# Patient Record
Sex: Female | Born: 1985 | Race: White | Hispanic: No | Marital: Married | Smoking: Former smoker
Health system: Southern US, Community
[De-identification: ages and names within clinical notes are randomized; demographics above are authoritative.]

## PROBLEM LIST (undated history)

## (undated) DIAGNOSIS — D649 Anemia, unspecified: Secondary | ICD-10-CM

---
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
## 2005-03-15 ENCOUNTER — Emergency Department: Payer: Self-pay | Admitting: Emergency Medicine

## 2006-10-09 IMAGING — US US OB < 14 WEEKS - US OB TV
1 series · 16 of 28 positions shown · non-contrast
Comparison: none

REASON FOR EXAM: Pain
COMMENTS:  LMP: N/A

[Series 1: us ob < 14 weeks - us ob tv · 16 of 45 slices shown]
[im 1/45]
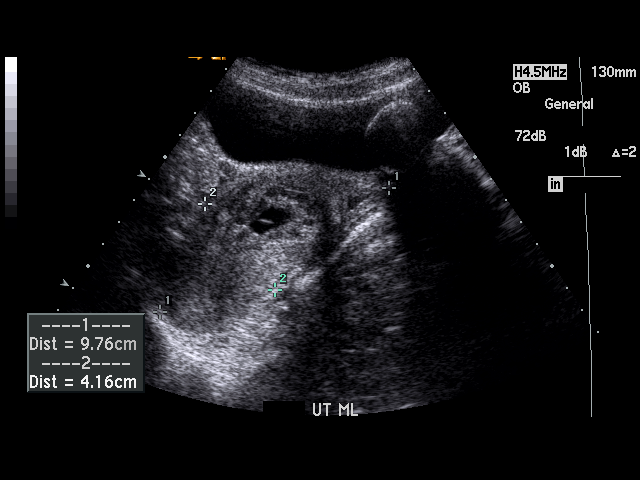
[im 4/45]
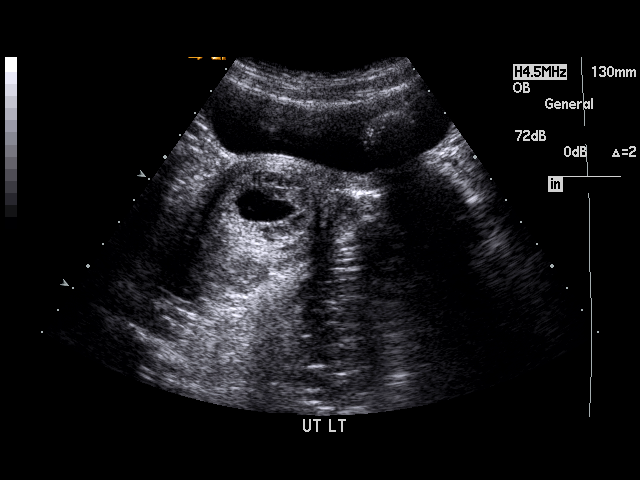
[im 7/45]
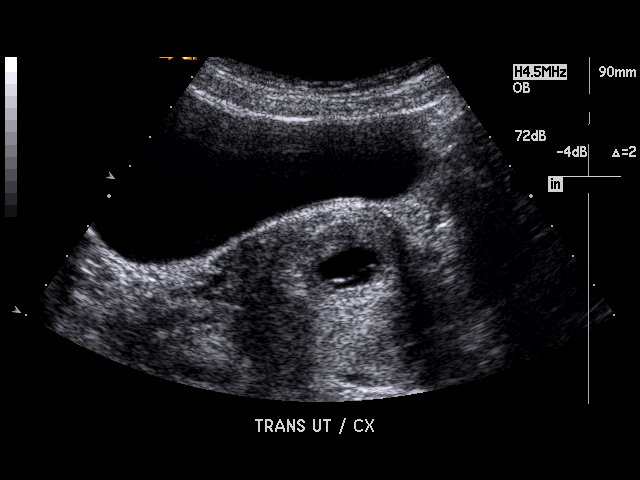
[im 10/45]
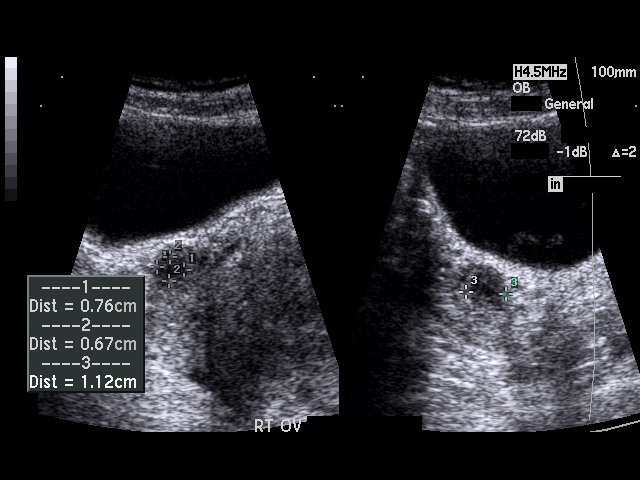
[im 12/45]
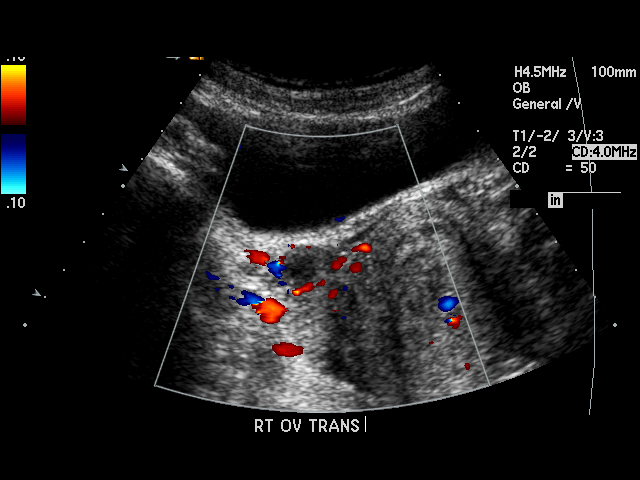
[im 15/45]
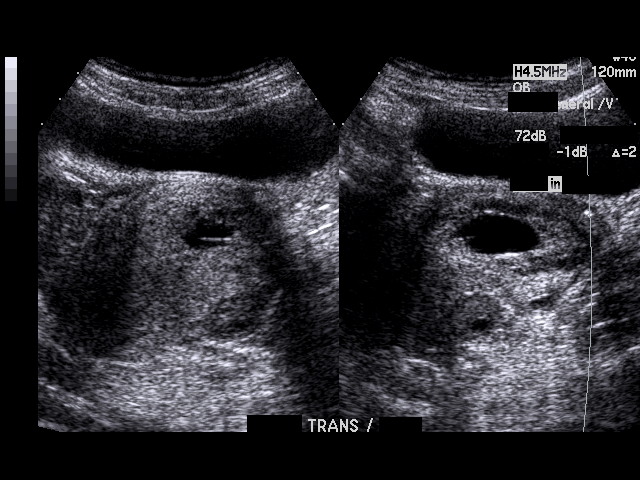
[im 18/45]
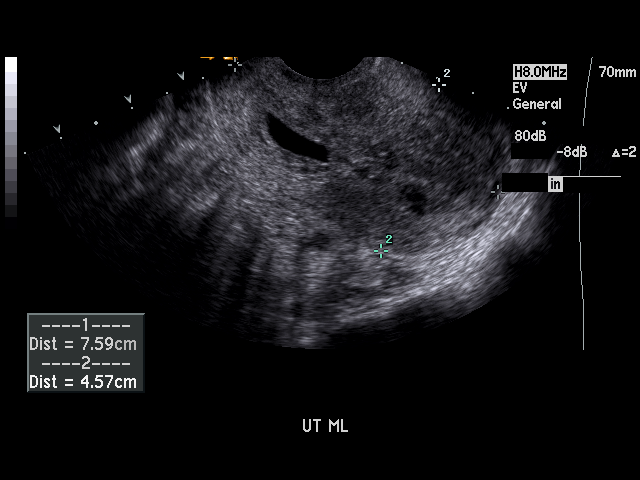
[im 22/45]
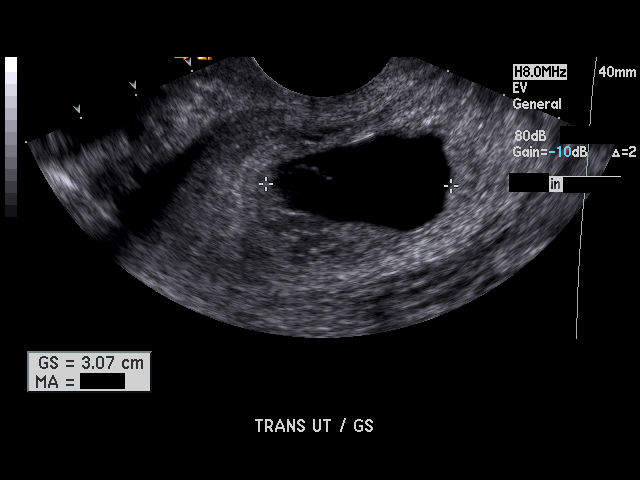
[im 23/45]
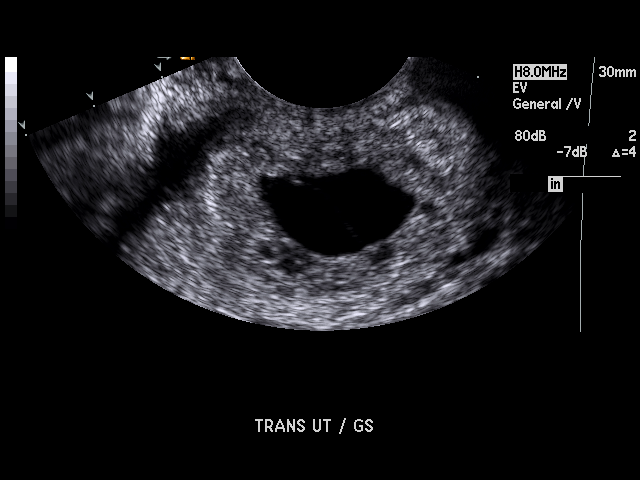
[im 27/45]
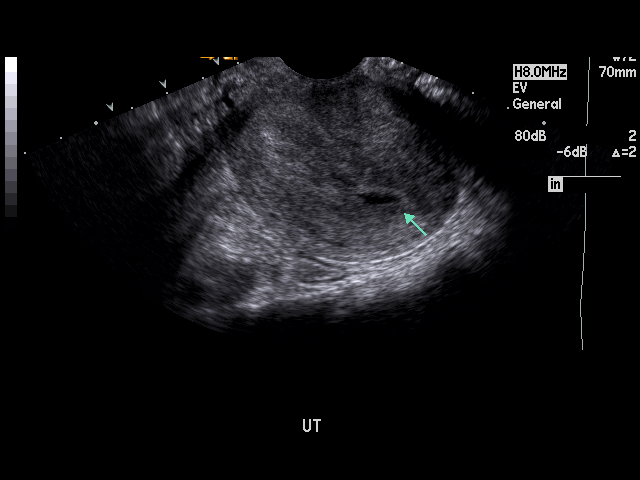
[im 30/45]
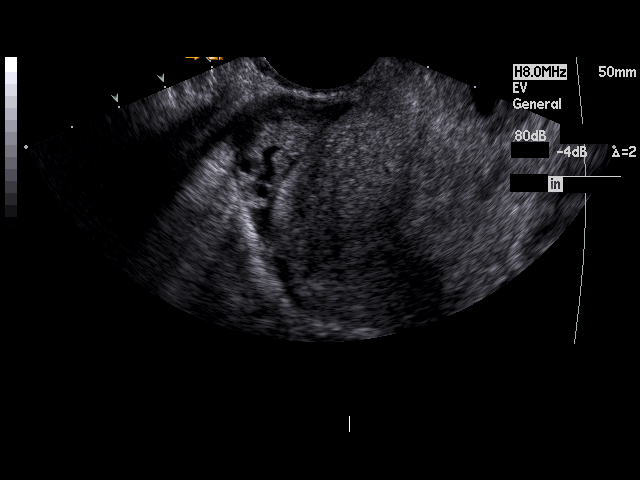
[im 33/45]
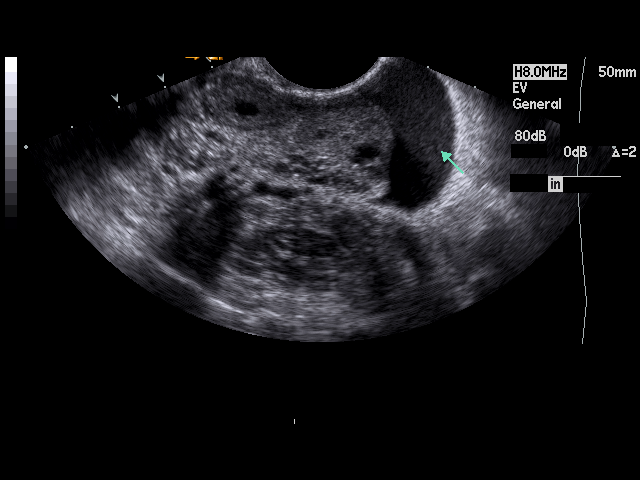
[im 35/45]
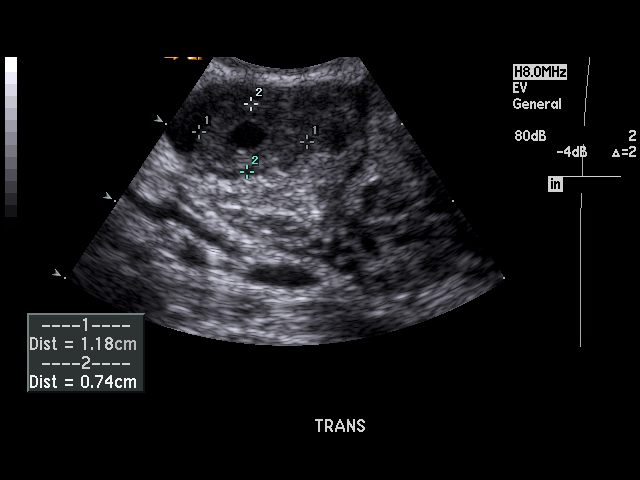
[im 38/45]
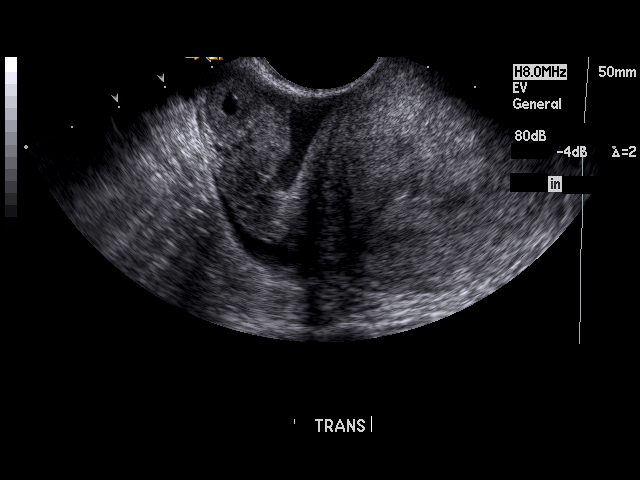
[im 41/45]
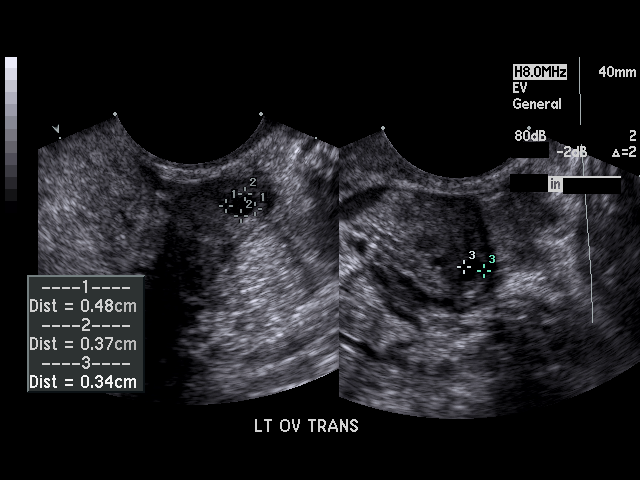
[im 45/45]
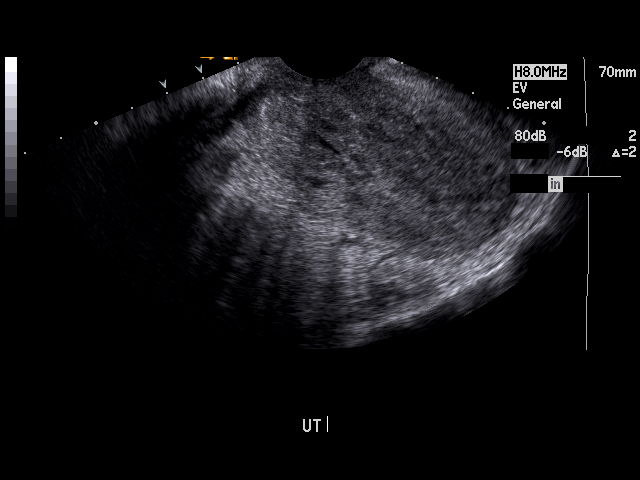

[16 of 28 positions shown; findings below may reference images not displayed]

PROCEDURE:     US  - US OB LESS THAN 14 WEEKS  - March 15, 2005 [DATE]

RESULT:          An intrauterine gestational sac is appreciated which
measures approximately 3.07 cm. This corresponds to an estimated gestational
age of 8 weeks, 0 days.  There does not appear to be evidence of a fetal
pole nor evidence of a yolk sac.  The remaining components of the uterus
appear to demonstrate a homogeneous echotexture.  The uterus measures 9.8 x
4.2 x 5.4 cm.  Complex free fluid is demonstrated in the region of the RIGHT
adnexa.  The RIGHT ovary measures 1.8 x 1.7 x 3.7 cm.  A cystic area is
demonstrated within the RIGHT ovary likely representing a corpus luteal cyst
measuring 1.2 x 0.7 cm.  As stated above, complex free fluid is demonstrated
within the RIGHT adnexal region.

The LEFT ovary measures 2.2 x 1.1 x 2 cm.  A hypoechoic focus is
demonstrated within the LEFT ovary measuring 0.5 x 0.4 x 0.3 cm also
possibly representing a follicle.  There does not appear to be evidence of
LEFT adnexal masses, free fluid, nor drainable loculated fluid collections.
IMPRESSION: 1.     Findings which appear to be consistent with a blighted ovum.  Repeat
evaluation is recommended if it is clinically warranted.
2.     Complex free fluid within the RIGHT adnexal region which may be
secondary to the uterine findings.
3.     Findings which appear to be consistent with a resolving corpus luteal
cyst in the RIGHT ovary as well as possibly a follicle within the LEFT
ovary.
4.     The gestational sac appears to correspond with an estimated
gestational age of 8 weeks and 0 days.  No evidence of a fetal heart tone,
yolk sac, nor fetal pole is appreciated.
5.     These findings were relayed to Dr. Niedercorn-Scholtes [place], of the emergency
department, at the time of the initial interpretation.

## 2006-12-22 ENCOUNTER — Inpatient Hospital Stay: Payer: Self-pay | Admitting: Obstetrics & Gynecology

## 2009-05-11 ENCOUNTER — Emergency Department: Payer: Self-pay | Admitting: Emergency Medicine

## 2009-10-26 ENCOUNTER — Inpatient Hospital Stay: Payer: Self-pay | Admitting: Obstetrics and Gynecology

## 2018-01-06 ENCOUNTER — Ambulatory Visit: Payer: Medicaid Other | Admitting: Obstetrics and Gynecology

## 2018-08-23 ENCOUNTER — Other Ambulatory Visit: Payer: Self-pay

## 2018-08-23 ENCOUNTER — Emergency Department
Admission: EM | Admit: 2018-08-23 | Discharge: 2018-08-23 | Disposition: A | Discharge status: Home / Self Care | Payer: Medicaid Other | Attending: Emergency Medicine | Admitting: Emergency Medicine

## 2018-08-23 ENCOUNTER — Emergency Department: Payer: Medicaid Other

## 2018-08-23 DIAGNOSIS — O26891 Other specified pregnancy related conditions, first trimester: Secondary | ICD-10-CM

## 2018-08-23 DIAGNOSIS — O200 Threatened abortion: Secondary | ICD-10-CM

## 2018-08-23 DIAGNOSIS — Z3A01 Less than 8 weeks gestation of pregnancy: Secondary | ICD-10-CM | POA: Insufficient documentation

## 2018-08-23 HISTORY — DX: Anemia, unspecified: D64.9

## 2018-08-23 LAB — COMPREHENSIVE METABOLIC PANEL
ALT: 13 U/L (ref 0–44)
AST: 14 U/L — ABNORMAL LOW (ref 15–41)
Albumin: 4.5 g/dL (ref 3.5–5.0)
Alkaline Phosphatase: 53 U/L (ref 38–126)
Anion gap: 9 (ref 5–15)
BUN: 9 mg/dL (ref 6–20)
CO2: 23 mmol/L (ref 22–32)
Calcium: 9 mg/dL (ref 8.9–10.3)
Chloride: 105 mmol/L (ref 98–111)
Creatinine, Ser: 0.51 mg/dL (ref 0.44–1.00)
GFR calc Af Amer: >60 mL/min (ref >60)
GFR calc non Af Amer: >60 mL/min (ref >60)
Glucose, Bld: 110 mg/dL — ABNORMAL HIGH (ref 70–99)
Potassium: 3.6 mmol/L (ref 3.5–5.1)
Sodium: 137 mmol/L (ref 135–145)
Total Bilirubin: 0.5 mg/dL (ref 0.3–1.2)
Total Protein: 7.3 g/dL (ref 6.5–8.1)

## 2018-08-23 LAB — URINALYSIS, COMPLETE (UACMP) WITH MICROSCOPIC
Bacteria, UA: NONE SEEN
Bilirubin Urine: NEGATIVE
Glucose, UA: NEGATIVE mg/dL
Ketones, ur: NEGATIVE mg/dL
Leukocytes,Ua: NEGATIVE
Nitrite: NEGATIVE
Protein, ur: NEGATIVE mg/dL
Specific Gravity, Urine: 1.018 (ref 1.005–1.030)
Squamous Epithelial / LPF: NONE SEEN (ref 0–5)
pH: 7 (ref 5.0–8.0)

## 2018-08-23 LAB — CBC WITH DIFFERENTIAL/PLATELET
Abs Immature Granulocytes: 0.02 10*3/uL (ref 0.00–0.07)
Basophils Absolute: 0 10*3/uL (ref 0.0–0.1)
Basophils Relative: 1 %
Eosinophils Absolute: 0.2 10*3/uL (ref 0.0–0.5)
Eosinophils Relative: 3 %
HCT: 39.7 % (ref 36.0–46.0)
Hemoglobin: 13.6 g/dL (ref 12.0–15.0)
Immature Granulocytes: 0 %
Lymphocytes Relative: 29 %
Lymphs Abs: 2.2 10*3/uL (ref 0.7–4.0)
MCH: 30.2 pg (ref 26.0–34.0)
MCHC: 34.3 g/dL (ref 30.0–36.0)
MCV: 88 fL (ref 80.0–100.0)
Monocytes Absolute: 0.6 10*3/uL (ref 0.1–1.0)
Monocytes Relative: 8 %
Neutro Abs: 4.4 10*3/uL (ref 1.7–7.7)
Neutrophils Relative %: 59 %
Platelets: 294 10*3/uL (ref 150–400)
RBC: 4.51 MIL/uL (ref 3.87–5.11)
RDW: 12.2 % (ref 11.5–15.5)
WBC: 7.5 10*3/uL (ref 4.0–10.5)
nRBC: 0 % (ref 0.0–0.2)

## 2018-08-23 LAB — HCG, QUANTITATIVE, PREGNANCY: hCG, Beta Chain, Quant, S: 2115 m[IU]/mL — ABNORMAL HIGH (ref <5)

## 2018-08-23 MED ORDER — SODIUM CHLORIDE 0.9 % IV SOLN: Freq: Once | INTRAVENOUS | Status: DC

## 2018-08-23 MED ORDER — ONDANSETRON 4 MG PO TBDP: 4.0000 mg | ORAL_TABLET | Freq: Three times a day (TID) | ORAL | 0 refills | Status: DC | PRN

## 2018-08-23 MED ORDER — ACETAMINOPHEN 325 MG PO TABS
650.0000 mg | ORAL_TABLET | Freq: Once | ORAL | Status: AC
  Administered 2018-08-23: 650 mg via ORAL
  Filled 2018-08-23: qty 2

## 2018-08-23 MED ORDER — ONDANSETRON HCL 4 MG/2ML IJ SOLN
4.0000 mg | Freq: Once | INTRAMUSCULAR | Status: AC
  Administered 2018-08-23: 4 mg via INTRAVENOUS
  Filled 2018-08-23: qty 2

## 2018-08-23 MED ORDER — SODIUM CHLORIDE 0.9 % IV SOLN
Freq: Once | INTRAVENOUS | Status: AC
  Administered 2018-08-23: 14:00:00 via INTRAVENOUS

## 2018-08-23 NOTE — ED Triage Notes (Signed)
Pt c/o BL rib/upper abd pain with N/v/d and SOB for the past 5 days

## 2018-08-23 NOTE — ED Provider Notes (Signed)
[facility] Emergency Department Provider Note       Time seen: ----------------------------------------- 1:06 PM on 08/23/2018 -----------------------------------------   I have reviewed the triage vital signs and the nursing notes.  HISTORY   Chief Complaint Abdominal Pain and Shortness of Breath   HPI Dawn Weaver is a 33 y.o. female with a history of anemia who presents to the ED for bilateral rib and upper abdominal pain with nausea, vomiting and diarrhea and shortness of breath for the past 5 days.  Patient states she has not had a menstrual cycle since April.  She has seen some blood vaginally.  Pain is 7 out of 10 on her chest wall.  Past Medical History:  Diagnosis Date  . Anemia     There are no active problems to display for this patient.   History reviewed. No pertinent surgical history.  Allergies Patient has no known allergies.  Social History Social History   Tobacco Use  . Smoking status: Not on file  Substance Use Topics  . Alcohol use: Not on file  . Drug use: Not on file   Review of Systems Constitutional: Negative for fever. Cardiovascular: Positive for chest pain Respiratory: Positive for shortness of breath Gastrointestinal: Positive for abdominal pain with nausea, vomiting and diarrhea Genitourinary: Positive for vaginal bleeding Musculoskeletal: Negative for back pain. Skin: Negative for rash. Neurological: Negative for headaches, focal weakness or numbness.  All systems negative/normal/unremarkable except as stated in the HPI  ____________________________________________   PHYSICAL EXAM:  VITAL SIGNS: ED Triage Vitals  Enc Vitals Group     BP 08/23/18 1128 (!) 148/93     Pulse Rate 08/23/18 1128 83     Resp 08/23/18 1128 18     Temp 08/23/18 1128 98.3 F (36.8 C)     Temp Source 08/23/18 1128 Oral     SpO2 08/23/18 1128 100 %     Weight 08/23/18 1131 123 lb (55.8 kg)     Height 08/23/18 1131 5'  2" (1.575 m)     Head Circumference --      Peak Flow --      Pain Score 08/23/18 1130 7     Pain Loc --      Pain Edu? --      Excl. in [place]? --    Constitutional: Alert and oriented. Well appearing and in no distress. Eyes: Conjunctivae are normal. Normal extraocular movements. ENT      Head: Normocephalic and atraumatic.      Nose: No congestion/rhinnorhea.      Mouth/Throat: Mucous membranes are moist.      Neck: No stridor. Cardiovascular: Normal rate, regular rhythm. No murmurs, rubs, or gallops. Respiratory: Normal respiratory effort without tachypnea nor retractions. Breath sounds are clear and equal bilaterally. No wheezes/rales/rhonchi. Gastrointestinal: Soft and nontender. Normal bowel sounds Musculoskeletal: Nontender with normal range of motion in extremities. No lower extremity tenderness nor edema. Neurologic:  Normal speech and language. No gross focal neurologic deficits are appreciated.  Skin:  Skin is warm, dry and intact. No rash noted. Psychiatric: Mood and affect are normal. Speech and behavior are normal.  ____________________________________________  ED COURSE:  As part of my medical decision making, I reviewed the following data within the electronic MEDICAL RECORD NUMBER History obtained from family if available, nursing notes, old chart and ekg, as well as notes from prior ED visits. Patient presented for possible pregnancy, we will assess with labs and imaging as indicated at this time.  Procedures  Dawn Weaver was evaluated in Emergency Department on 08/23/2018 for the symptoms described in the history of present illness. She was evaluated in the context of the global COVID-19 pandemic, which necessitated consideration that the patient might be at risk for infection with the SARS-CoV-2 virus that causes COVID-19. Institutional protocols and algorithms that pertain to the evaluation of patients at risk for COVID-19 are in a state of rapid change based on  information released by regulatory bodies including the CDC and federal and state organizations. These policies and algorithms were followed during the patient's care in the ED.  ____________________________________________   LABS (pertinent positives/negatives)  Labs Reviewed  COMPREHENSIVE METABOLIC PANEL - Abnormal; Notable for the following components:      Result Value   Glucose, Bld 110 (*)    AST 14 (*)    All other components within normal limits  URINALYSIS, COMPLETE (UACMP) WITH MICROSCOPIC - Abnormal; Notable for the following components:   Color, Urine YELLOW (*)    APPearance CLEAR (*)    Hgb urine dipstick SMALL (*)    All other components within normal limits  HCG, QUANTITATIVE, PREGNANCY - Abnormal; Notable for the following components:   hCG, Beta Chain, Quant, S 2,115 (*)    All other components within normal limits  CBC WITH DIFFERENTIAL/PLATELET    RADIOLOGY Images were viewed by me  Pregnancy ultrasound IMPRESSION: 1. Probable early intrauterine gestational sac, but no yolk sac, fetal pole, or cardiac activity yet visualized. Recommend follow-up quantitative B-HCG levels and follow-up US in 14 days to assess viability. This recommendation follows SRU consensus guidelines: Diagnostic Criteria for Nonviable Pregnancy Early in the First Trimester. Malva Limes Med 2013; 173:5670-14. 2. 2.5 cm corpus luteum cyst in the right ovary. ____________________________________________   DIFFERENTIAL DIAGNOSIS   Threatened miscarriage, ectopic, dehydration, electrolyte abnormality  FINAL ASSESSMENT AND PLAN  First trimester pregnancy, threatened miscarriage   Plan: The patient had presented for symptoms of early pregnancy, most of her symptoms seem to start with persistent nausea, vomiting and poor p.o. intake. Patient's labs did confirm early pregnancy. Patient's imaging did reveal a probable early intrauterine gestational sac.  We will have her follow-up here or with  her OB/GYN doctor in 2 days for recheck.   Ulice Dash, MD    Note: This note was generated in part or whole with voice recognition software. Voice recognition is usually quite accurate but there are transcription errors that can and very often do occur. I apologize for any typographical errors that were not detected and corrected.     Emily Filbert, MD 08/23/18 [phone]

## 2018-08-23 NOTE — ED Notes (Signed)
Pt reports that she is unable to void or ambulate to restroom without becoming SHOB d/t the pain

## 2018-08-23 NOTE — ED Notes (Signed)
Patient transported to Ultrasound 

## 2018-08-23 NOTE — ED Notes (Signed)
Pt assisted to toilet to void 

## 2018-08-23 NOTE — ED Notes (Signed)
Pt requested assistance to void - after obtaining urine sample there was no blood noted on toilet tissue or in toilet Pt now reports that the pain radiates to left flank and back

## 2018-08-23 NOTE — ED Notes (Signed)
Pt reports no period since April 24th - she c/o right upper abd pain that started Friday - she reports that she started spotting Saturday with N/V - today she reports increase in nausea, bleeding and abd pain  SHOB that started Saturday with the spotting

## 2018-10-21 ENCOUNTER — Emergency Department
Admission: EM | Admit: 2018-10-21 | Discharge: 2018-10-21 | Disposition: A | Discharge status: Home / Self Care | Payer: Medicaid Other | Attending: Student in an Organized Health Care Education/Training Program | Admitting: Student in an Organized Health Care Education/Training Program

## 2018-10-21 ENCOUNTER — Encounter: Payer: Self-pay | Admitting: Emergency Medicine

## 2018-10-21 ENCOUNTER — Other Ambulatory Visit: Payer: Self-pay

## 2018-10-21 DIAGNOSIS — O039 Complete or unspecified spontaneous abortion without complication: Secondary | ICD-10-CM | POA: Insufficient documentation

## 2018-10-21 DIAGNOSIS — Z3A00 Weeks of gestation of pregnancy not specified: Secondary | ICD-10-CM | POA: Insufficient documentation

## 2018-10-21 LAB — HCG, QUANTITATIVE, PREGNANCY: hCG, Beta Chain, Quant, S: 1 m[IU]/mL (ref <5)

## 2018-10-21 LAB — POCT PREGNANCY, URINE: Preg Test, Ur: NEGATIVE

## 2018-10-21 NOTE — Discharge Instructions (Signed)
Your hormone levels are back to 0 (zero) by blood and urine testing. This confirms that your previous miscarriage is complete. You likely had some small amount hormone level still circulating in the blood/urine last week when you tested. You can expect to have a normal period in the next 2-4 weeks. Follow-up with [facility] Department, or return as needed.

## 2018-10-21 NOTE — ED Triage Notes (Signed)
Pt reports had a miscarriage in June and has taken 6 pregnancy test since then with the last one being last week and all were positive. Pt concerned she may be pregnant again or is not sure what is going on.

## 2018-10-21 NOTE — ED Provider Notes (Signed)
[facility] Emergency Department Provider Note ____________________________________________  Time seen: 1406  I have reviewed the triage vital signs and the nursing notes.  HISTORY  Chief Complaint  Vaginal Bleeding  HPI Dawn Weaver is a 33 y.o. female presents with self to the ED for evaluation of some scant vaginal bleeding as well as concern over previous home pregnancy test being positive.  Patient apparently was found to be pregnant in June, but a [redacted] weeks gestation, was diagnosed with a threatened abortion.  She was evaluated by [facility], and was due to have serial beta quant drawn.  Her last visit was on June 3, where she was confirmed by ultrasound to have a non-viable IUP.  Patient was unable to follow[facility] with [facility] secondary to financial constraints as she did not have insurance at the time.  Patient admits to about 10 days of heavy bleeding and spotting at the end of last month, consistent with her expected spontaneous abortion.  Patient has, however, continued to take home pregnancy tests, including the most recently taken about a week prior, which remain positive.  She is here because she is not sure whether she is pregnant with a new pregnancy or if this is still possibly related to her previous miscarriage.  She has never experienced a miscarriage before, and was not sure that she would continue to have positive home pricey test but she denies any fevers, chills, sweats patient also denies any pelvic pain, vaginal discharge, or abdominal pain.  She is here for evaluation as noted.  She reports only spotting at this time.  Past Medical History:  Diagnosis Date  . Anemia     There are no active problems to display for this patient.   History reviewed. No pertinent surgical history.  Prior to Admission medications   Medication Sig Start Date End Date Taking? Authorizing Provider  ondansetron (ZOFRAN ODT) 4 MG disintegrating  tablet Take 1 tablet (4 mg total) by mouth every 8 (eight) hours as needed for nausea or vomiting. 08/23/18   Emily FilbertWilliams, Jonathan E, MD    Allergies Patient has no known allergies.  No family history on file.  Social History Social History   Tobacco Use  . Smoking status: Not on file  Substance Use Topics  . Alcohol use: Not on file  . Drug use: Not on file    Review of Systems  Constitutional: Negative for fever. Eyes: Negative for visual changes. ENT: Negative for sore throat. Cardiovascular: Negative for chest pain. Respiratory: Negative for shortness of breath. Gastrointestinal: Negative for abdominal pain, vomiting and diarrhea. Genitourinary: Negative for dysuria.  Scant vaginal bleeding. Musculoskeletal: Negative for back pain. Skin: Negative for rash. Neurological: Negative for headaches, focal weakness or numbness. ____________________________________________  PHYSICAL EXAM:  VITAL SIGNS: ED Triage Vitals  Enc Vitals Group     BP 10/21/18 1021 135/82     Pulse Rate 10/21/18 1021 82     Resp 10/21/18 1021 18     Temp 10/21/18 1021 98.7 F (37.1 C)     Temp Source 10/21/18 1021 Oral     SpO2 10/21/18 1021 100 %     Weight 10/21/18 1012 110 lb (49.9 kg)     Height 10/21/18 1012 5\' 2"  (1.575 m)     Head Circumference --      Peak Flow --      Pain Score 10/21/18 1012 0     Pain Loc --      Pain  Edu? --      Excl. in [place]? --     Constitutional: Alert and oriented. Well appearing and in no distress. Head: Normocephalic and atraumatic. Eyes: Conjunctivae are normal. Normal extraocular movements Cardiovascular: Normal rate, regular rhythm. Normal distal pulses. Respiratory: Normal respiratory effort. No wheezes/rales/rhonchi. Gastrointestinal: Soft and nontender. No distention. GU: deferred Musculoskeletal: Nontender with normal range of motion in all extremities.  Neurologic:  Normal gait without ataxia. Normal speech and language. No gross focal neurologic  deficits are appreciated. Skin:  Skin is warm, dry and intact. No rash noted. Psychiatric: Mood and affect are normal. Patient exhibits appropriate insight and judgment. ____________________________________________   LABS (pertinent positives/negatives) Labs Reviewed  HCG, QUANTITATIVE, PREGNANCY  POC URINE PREG, ED  POCT PREGNANCY, URINE  ____________________________________________  PROCEDURES  Procedures ____________________________________________  INITIAL IMPRESSION / ASSESSMENT AND PLAN / ED COURSE  Dawn Weaver was evaluated in Emergency Department on 10/21/2018 for the symptoms described in the history of present illness. She was evaluated in the context of the global COVID-19 pandemic, which necessitated consideration that the patient might be at risk for infection with the SARS-CoV-2 virus that causes COVID-19. Institutional protocols and algorithms that pertain to the evaluation of patients at risk for COVID-19 are in a state of rapid change based on information released by regulatory bodies including the CDC and federal and state organizations. These policies and algorithms were followed during the patient's care in the ED.  Patient with ED evaluation over continue positive home pregnancy test despite a confirmed spontaneous abortion last month.  Patient was found today to have a negative beta quant by both blood and urine.  I discussed with the patient likelihood that even as late as last week, she may have had enough circulating hormone to give her a positive home urine pregnancy test.  Patient is understanding today that her urine and blood labs have confirmed that she has completely miscarried and does not currently have a pregnancy.  Patient is expecting that she may in fact start her menstrual period with her current spotting.  She verbalized understanding of her discharge instructions, and is referred to the local health department for ongoing symptoms.  Patient is  discharged at this time to follow-up as discussed. ____________________________________________  FINAL CLINICAL IMPRESSION(S) / ED DIAGNOSES  Final diagnoses:  Complete miscarriage     Carmie End, Dannielle Karvonen, PA-C 10/21/18 2021    Merlyn Lot, MD 10/21/18 2100

## 2018-10-21 NOTE — ED Notes (Signed)
Pt states she was 5 and 1/[redacted] weeks pregnant, she had a miscarriage on June 3rd, 2020. She took a pregnancy test on June 25th, and it was positive. She has taken 5 pregnancy test since June 25th and all results were positive. She woke up yesterday with brown vaginal discharge, then last night and this morning she has experienced bright red vaginalbleeding.

## 2019-01-12 LAB — OB RESULTS CONSOLE RUBELLA ANTIBODY, IGM: Rubella: IMMUNE

## 2019-01-12 LAB — OB RESULTS CONSOLE VARICELLA ZOSTER ANTIBODY, IGG: Varicella: IMMUNE

## 2019-01-12 LAB — OB RESULTS CONSOLE HEPATITIS B SURFACE ANTIGEN: Hepatitis B Surface Ag: NEGATIVE

## 2019-03-25 NOTE — L&D Delivery Note (Signed)
Delivery Note  First Stage: Labor onset: 1830 Augmentation: Pitocin Analgesia /Anesthesia intrapartum: epidural SROM at 1830  Second Stage: Complete dilation at 0002 Onset of pushing at 0005 FHR second stage Cat II, variables with pushing  Delivery of a viable female infant on 08/02/19 at 0030 by Heloise Ochoa CNM delivery of fetal head in LOA position with restitution to LOT. No nuchal cord;  Anterior then posterior shoulders delivered easily with gentle downward traction. Baby placed on mom's chest, and attended to by peds.  Cord double clamped after cessation of pulsation, cut by FOB. Cord blood sample collected   Third Stage: Placenta delivered spontaneously intact with 3VC @ 0039 Placenta disposition: routine disposal Uterine tone Firm / bleeding small  1st deg perineal laceration identified  Anesthesia for repair: epidural Repair in usual fashion with 2-0 Vicryl CT-1 Est. Blood Loss (mL): 250  Complications: none  Mom to postpartum.  Baby to Couplet care / Skin to Skin.  Newborn: Birth Weight: pending  Apgar Scores: 8/9 Feeding planned: Breast

## 2019-07-01 LAB — OB RESULTS CONSOLE GC/CHLAMYDIA
Chlamydia: NEGATIVE
Gonorrhea: NEGATIVE

## 2019-07-01 LAB — OB RESULTS CONSOLE GBS: GBS: NEGATIVE

## 2019-07-01 LAB — OB RESULTS CONSOLE RPR: RPR: NONREACTIVE

## 2019-07-01 LAB — OB RESULTS CONSOLE HIV ANTIBODY (ROUTINE TESTING): HIV: NONREACTIVE

## 2019-08-01 ENCOUNTER — Encounter: Payer: Self-pay | Admitting: Obstetrics and Gynecology

## 2019-08-01 ENCOUNTER — Other Ambulatory Visit: Payer: Self-pay

## 2019-08-01 ENCOUNTER — Inpatient Hospital Stay: Payer: Medicaid Other | Admitting: Anesthesiology

## 2019-08-01 ENCOUNTER — Inpatient Hospital Stay
Admission: EM | Admit: 2019-08-01 | Discharge: 2019-08-03 | DRG: 806 | Disposition: A | Discharge status: Home / Self Care | Payer: Medicaid Other | Attending: Obstetrics and Gynecology | Admitting: Obstetrics and Gynecology

## 2019-08-01 DIAGNOSIS — Z87891 Personal history of nicotine dependence: Secondary | ICD-10-CM | POA: Diagnosis not present

## 2019-08-01 DIAGNOSIS — O4292 Full-term premature rupture of membranes, unspecified as to length of time between rupture and onset of labor: Secondary | ICD-10-CM | POA: Diagnosis present

## 2019-08-01 DIAGNOSIS — O4200 Premature rupture of membranes, onset of labor within 24 hours of rupture, unspecified weeks of gestation: Secondary | ICD-10-CM | POA: Diagnosis present

## 2019-08-01 DIAGNOSIS — O9081 Anemia of the puerperium: Secondary | ICD-10-CM | POA: Diagnosis not present

## 2019-08-01 DIAGNOSIS — D62 Acute posthemorrhagic anemia: Secondary | ICD-10-CM | POA: Diagnosis not present

## 2019-08-01 DIAGNOSIS — O26893 Other specified pregnancy related conditions, third trimester: Secondary | ICD-10-CM | POA: Diagnosis present

## 2019-08-01 DIAGNOSIS — Z20822 Contact with and (suspected) exposure to covid-19: Secondary | ICD-10-CM | POA: Diagnosis present

## 2019-08-01 DIAGNOSIS — Z3A40 40 weeks gestation of pregnancy: Secondary | ICD-10-CM | POA: Diagnosis not present

## 2019-08-01 LAB — TYPE AND SCREEN
ABO/RH(D): O POS
Antibody Screen: NEGATIVE

## 2019-08-01 LAB — OB RESULTS CONSOLE ABO/RH

## 2019-08-01 LAB — CBC
HCT: 30.4 % — ABNORMAL LOW (ref 36.0–46.0)
Hemoglobin: 9.8 g/dL — ABNORMAL LOW (ref 12.0–15.0)
MCH: 25.3 pg — ABNORMAL LOW (ref 26.0–34.0)
MCHC: 32.2 g/dL (ref 30.0–36.0)
MCV: 78.4 fL — ABNORMAL LOW (ref 80.0–100.0)
Platelets: 316 10*3/uL (ref 150–400)
RBC: 3.88 MIL/uL (ref 3.87–5.11)
RDW: 15.6 % — ABNORMAL HIGH (ref 11.5–15.5)
WBC: 12.4 10*3/uL — ABNORMAL HIGH (ref 4.0–10.5)
nRBC: 0 % (ref 0.0–0.2)

## 2019-08-01 LAB — SARS CORONAVIRUS 2 BY RT PCR (HOSPITAL ORDER, PERFORMED IN ~~LOC~~ HOSPITAL LAB): SARS Coronavirus 2: NEGATIVE

## 2019-08-01 MED ORDER — ONDANSETRON HCL 4 MG/2ML IJ SOLN: 4.0000 mg | Freq: Four times a day (QID) | INTRAMUSCULAR | Status: DC | PRN

## 2019-08-01 MED ORDER — AMMONIA AROMATIC IN INHA
RESPIRATORY_TRACT | Status: AC
  Filled 2019-08-01: qty 10

## 2019-08-01 MED ORDER — ACETAMINOPHEN 325 MG PO TABS: 650.0000 mg | ORAL_TABLET | ORAL | Status: DC | PRN

## 2019-08-01 MED ORDER — MISOPROSTOL 200 MCG PO TABS
ORAL_TABLET | ORAL | Status: AC
  Filled 2019-08-01: qty 4

## 2019-08-01 MED ORDER — FENTANYL CITRATE (PF) 100 MCG/2ML IJ SOLN: 50.0000 ug | INTRAMUSCULAR | Status: DC | PRN

## 2019-08-01 MED ORDER — EPHEDRINE 5 MG/ML INJ: 10.0000 mg | INTRAVENOUS | Status: DC | PRN

## 2019-08-01 MED ORDER — OXYTOCIN BOLUS FROM INFUSION: 500.0000 mL | Freq: Once | INTRAVENOUS | Status: DC

## 2019-08-01 MED ORDER — LACTATED RINGERS IV SOLN: 500.0000 mL | INTRAVENOUS | Status: DC | PRN

## 2019-08-01 MED ORDER — LACTATED RINGERS IV SOLN: 500.0000 mL | Freq: Once | INTRAVENOUS | Status: DC

## 2019-08-01 MED ORDER — TERBUTALINE SULFATE 1 MG/ML IJ SOLN: 0.2500 mg | Freq: Once | INTRAMUSCULAR | Status: DC | PRN

## 2019-08-01 MED ORDER — OXYTOCIN 40 UNITS IN NORMAL SALINE INFUSION - SIMPLE MED
1.0000 m[IU]/min | INTRAVENOUS | Status: DC
  Administered 2019-08-01: 22:00:00 2 m[IU]/min via INTRAVENOUS
  Filled 2019-08-01: qty 1000

## 2019-08-01 MED ORDER — FENTANYL 2.5 MCG/ML W/ROPIVACAINE 0.15% IN NS 100 ML EPIDURAL (ARMC)
EPIDURAL | Status: AC
  Filled 2019-08-01: qty 100

## 2019-08-01 MED ORDER — SODIUM CHLORIDE 0.9 % IV SOLN
INTRAVENOUS | Status: DC | PRN
  Administered 2019-08-01 (×2): 5 mL via EPIDURAL

## 2019-08-01 MED ORDER — FENTANYL 2.5 MCG/ML W/ROPIVACAINE 0.15% IN NS 100 ML EPIDURAL (ARMC)
12.0000 mL/h | EPIDURAL | Status: DC
  Administered 2019-08-01: 12 mL/h via EPIDURAL

## 2019-08-01 MED ORDER — LIDOCAINE HCL (PF) 1 % IJ SOLN
INTRAMUSCULAR | Status: DC | PRN
  Administered 2019-08-01: 2 mL via SUBCUTANEOUS

## 2019-08-01 MED ORDER — LACTATED RINGERS IV SOLN: INTRAVENOUS | Status: DC

## 2019-08-01 MED ORDER — LIDOCAINE HCL (PF) 1 % IJ SOLN
INTRAMUSCULAR | Status: AC
  Filled 2019-08-01: qty 30

## 2019-08-01 MED ORDER — PHENYLEPHRINE 40 MCG/ML (10ML) SYRINGE FOR IV PUSH (FOR BLOOD PRESSURE SUPPORT): 80.0000 ug | PREFILLED_SYRINGE | INTRAVENOUS | Status: DC | PRN

## 2019-08-01 MED ORDER — LIDOCAINE HCL (PF) 1 % IJ SOLN: 30.0000 mL | INTRAMUSCULAR | Status: DC | PRN

## 2019-08-01 MED ORDER — DIPHENHYDRAMINE HCL 50 MG/ML IJ SOLN: 12.5000 mg | INTRAMUSCULAR | Status: DC | PRN

## 2019-08-01 MED ORDER — SOD CITRATE-CITRIC ACID 500-334 MG/5ML PO SOLN: 30.0000 mL | ORAL | Status: DC | PRN

## 2019-08-01 MED ORDER — OXYTOCIN 40 UNITS IN NORMAL SALINE INFUSION - SIMPLE MED
2.5000 [IU]/h | INTRAVENOUS | Status: DC
  Administered 2019-08-02: 2.5 [IU]/h via INTRAVENOUS
  Administered 2019-08-02: 01:00:00 39.96 [IU]/h via INTRAVENOUS
  Administered 2019-08-02: 01:00:00 2.5 [IU]/h via INTRAVENOUS
  Filled 2019-08-01: qty 1000

## 2019-08-01 MED ORDER — OXYTOCIN 10 UNIT/ML IJ SOLN
INTRAMUSCULAR | Status: AC
  Filled 2019-08-01: qty 2

## 2019-08-01 MED ORDER — LIDOCAINE-EPINEPHRINE (PF) 1.5 %-1:200000 IJ SOLN
INTRAMUSCULAR | Status: DC | PRN
  Administered 2019-08-01: 3 mL via EPIDURAL

## 2019-08-01 NOTE — Progress Notes (Signed)
Labor Progress Note  Dawn Weaver is a 34 y.o. [phone] at [redacted]w[redacted]d by ultrasound admitted for rupture of membranes  Subjective:  Feeling painful UCs and requested epidural.   Objective: BP (!) 110/57 (BP Location: Right Arm)   Pulse 97   Temp 98.3 F (36.8 C) (Oral)   Resp 18   Ht 5\' 2"  (1.575 m)   Wt 64.9 kg   LMP 10/20/2018 (Exact Date)   BMI 26.16 kg/m  Notable VS details: reveiwed  Fetal Assessment: FHT:  FHR: 125 bpm, variability: moderate,  accelerations:  Present,  decelerations:  Absent Category/reactivity:  Category I UC:   irregular, every 3-8 minutes, Pitocin started at 56mu/min SVE:   Last exam 2/75/-1 Membrane status: SROM at 1830 Amniotic color:  clear  Labs: Lab Results  Component Value Date   WBC 12.4 (H) 08/01/2019   HGB 9.8 (L) 08/01/2019   HCT 30.4 (L) 08/01/2019   MCV 78.4 (L) 08/01/2019   PLT 316 08/01/2019    Assessment / Plan: G6 P2032 at [redacted]w[redacted]d with SROM  Labor: started pitocin to augment irreg UC pattern.  Preeclampsia:  no e/o pre-e Fetal Wellbeing:  Category I Pain Control:  requesting epidural now.  I/D:  GBS neg Anticipated MOD:  NSVD  [redacted]w[redacted]d, CNM 08/01/2019, 10:58 PM

## 2019-08-01 NOTE — H&P (Signed)
OB History & Physical   History of Present Illness:  Chief Complaint: water broke this evening  HPI:  Dawn Weaver is a 34 y.o. [id number] female at [redacted]w[redacted]d dated by [redacted]w[redacted]d [place].  She presents to L&D for LOF  / SROM @ 1900, reports large gush of fluid that occurred just after taking a nap. Feeling pelvic pressure then felt a pop and large gush. Denies UCs, bloody show and reports active FM.   Pregnancy Issues: 1. Dating by [place], conceived after miscarriage late July 2020 2. Advanced maternal age, 42 at delivery 3. Anemia, not taking PO supplement d/t difficulty with pills.   Maternal Medical History:   Past Medical History:  Diagnosis Date  . Anemia     No past surgical history on file.  No Known Allergies  Prior to Admission medications   Medication Sig Start Date End Date Taking? Authorizing Provider  ondansetron (ZOFRAN ODT) 4 MG disintegrating tablet Take 1 tablet (4 mg total) by mouth every 8 (eight) hours as needed for nausea or vomiting. 08/23/18   Emily Filbert, MD     Prenatal care site: [facility]  Social History: She    Family History: no family hx Gyn cancers  Review of Systems: A full review of systems was performed and negative except as noted in the HPI.     Physical Exam:  Vital Signs: BP (!) 110/57 (BP Location: Right Arm)   Pulse 97   Temp 98.3 F (36.8 C) (Oral)   Resp 18   Ht 5\' 2"  (1.575 m)   Wt 64.9 kg   BMI 26.16 kg/m  General: no acute distress.  HEENT: normocephalic, atraumatic Heart: regular rate & rhythm.  No murmurs/rubs/gallops Lungs: clear to auscultation bilaterally, normal respiratory effort Abdomen: soft, gravid, non-tender;  EFW: 7lbs Pelvic:   External: Normal external female genitalia  Cervix: 2/75/-1, soft, midposition   Extremities: non-tender, symmetric, no edema bilaterally.  DTRs: 2+  Neurologic: Alert & oriented x 3.    No results found for this or any previous visit (from the past 24  hour(s)).  Pertinent Results:  Prenatal Labs: Blood type/Rh  O pos  Antibody screen neg  Rubella Immune  Varicella Immune  RPR NR  HBsAg Neg  HIV NR  GC neg  Chlamydia neg  Genetic screening 01/12/19: neg, XY; AFP negative  1 hour GTT  117  3 hour GTT  n/a  GBS  neg   FHT: 140bpm, mod variability, + accels, no decels TOCO: Irregular, toco adjusted. Palpated UC- moderate.    Cephalic by leopolds  No results found.  Assessment:  Dawn Weaver is a 34 y.o. G6 P46 female at [redacted]w[redacted]d with SROM.   Plan:  1. Admit to Labor & Delivery; consents reviewed and obtained - COVID testing on admission  2. Fetal Well being  - Fetal Tracing: Cat I tracing - Group B Streptococcus ppx indicated: negative - Presentation: cephalic confirmed by exam and Leopolds   3. Routine OB: - Prenatal labs reviewed, as above - Rh O pos - CBC, T&S, RPR on admit - Clear fluids, IVF  4. Monitoring of Labor -  Contractions: external toco in place -  Pelvis proven to 7#7 -  Plan for induction with Pitocin as needed -  Plan for continuous fetal monitoring  -  Maternal pain control as desired - Anticipate vaginal delivery  5. Post Partum Planning: - Infant feeding: breast - Contraception: NFP  [redacted]w[redacted]d, CNM 08/01/19 7:54  PM

## 2019-08-01 NOTE — Anesthesia Procedure Notes (Signed)
Epidural Patient location during procedure: OB Start time: 08/01/2019 10:59 PM End time: 08/01/2019 11:04 PM  Staffing Anesthesiologist: Lenard Simmer, MD Performed: anesthesiologist   Preanesthetic Checklist Completed: patient identified, IV checked, site marked, risks and benefits discussed, surgical consent, monitors and equipment checked, pre-op evaluation and timeout performed  Epidural Patient position: sitting Prep: ChloraPrep Patient monitoring: heart rate, continuous pulse ox and blood pressure Approach: midline Location: L3-L4 Injection technique: LOR saline  Needle:  Needle type: Tuohy  Needle gauge: 17 G Needle length: 9 cm and 9 Needle insertion depth: 4 cm Catheter type: closed end flexible Catheter size: 19 Gauge Catheter at skin depth: 9 cm Test dose: negative and 1.5% lidocaine with Epi 1:200 K  Assessment Sensory level: T10 Events: blood not aspirated, injection not painful, no injection resistance, no paresthesia and negative IV test  Additional Notes 1st attempt Pt. Evaluated and documentation done after procedure finished. Patient identified. Risks/Benefits/Options discussed with patient including but not limited to bleeding, infection, nerve damage, paralysis, failed block, incomplete pain control, headache, blood pressure changes, nausea, vomiting, reactions to medication both or allergic, itching and postpartum back pain. Confirmed with bedside nurse the patient's most recent platelet count. Confirmed with patient that they are not currently taking any anticoagulation, have any bleeding history or any family history of bleeding disorders. Patient expressed understanding and wished to proceed. All questions were answered. Sterile technique was used throughout the entire procedure. Please see nursing notes for vital signs. Test dose was given through epidural catheter and negative prior to continuing to dose epidural or start infusion. Warning signs of high  block given to the patient including shortness of breath, tingling/numbness in hands, complete motor block, or any concerning symptoms with instructions to call for help. Patient was given instructions on fall risk and not to get out of bed. All questions and concerns addressed with instructions to call with any issues or inadequate analgesia.   Patient tolerated the insertion well without immediate complications.Reason for block:procedure for pain

## 2019-08-02 ENCOUNTER — Other Ambulatory Visit: Payer: Self-pay | Admitting: Obstetrics and Gynecology

## 2019-08-02 ENCOUNTER — Encounter: Payer: Self-pay | Admitting: Obstetrics and Gynecology

## 2019-08-02 LAB — CBC
HCT: 28.5 % — ABNORMAL LOW (ref 36.0–46.0)
Hemoglobin: 9.1 g/dL — ABNORMAL LOW (ref 12.0–15.0)
MCH: 25.1 pg — ABNORMAL LOW (ref 26.0–34.0)
MCHC: 31.9 g/dL (ref 30.0–36.0)
MCV: 78.7 fL — ABNORMAL LOW (ref 80.0–100.0)
Platelets: 304 10*3/uL (ref 150–400)
RBC: 3.62 MIL/uL — ABNORMAL LOW (ref 3.87–5.11)
RDW: 15.4 % (ref 11.5–15.5)
WBC: 21 10*3/uL — ABNORMAL HIGH (ref 4.0–10.5)
nRBC: 0 % (ref 0.0–0.2)

## 2019-08-02 LAB — ABO/RH: ABO/RH(D): O POS

## 2019-08-02 LAB — RPR: RPR Ser Ql: NONREACTIVE

## 2019-08-02 MED ORDER — ONDANSETRON HCL 4 MG PO TABS: 4.0000 mg | ORAL_TABLET | ORAL | Status: DC | PRN

## 2019-08-02 MED ORDER — ACETAMINOPHEN 325 MG PO TABS: 650.0000 mg | ORAL_TABLET | ORAL | Status: DC | PRN

## 2019-08-02 MED ORDER — DIPHENHYDRAMINE HCL 25 MG PO CAPS: 25.0000 mg | ORAL_CAPSULE | Freq: Four times a day (QID) | ORAL | Status: DC | PRN

## 2019-08-02 MED ORDER — ZOLPIDEM TARTRATE 5 MG PO TABS: 5.0000 mg | ORAL_TABLET | Freq: Every evening | ORAL | Status: DC | PRN

## 2019-08-02 MED ORDER — IBUPROFEN 600 MG PO TABS
600.0000 mg | ORAL_TABLET | Freq: Four times a day (QID) | ORAL | Status: DC
  Administered 2019-08-02 – 2019-08-03 (×6): 600 mg via ORAL
  Filled 2019-08-02 (×6): qty 1

## 2019-08-02 MED ORDER — ONDANSETRON HCL 4 MG/2ML IJ SOLN: 4.0000 mg | INTRAMUSCULAR | Status: DC | PRN

## 2019-08-02 MED ORDER — SENNOSIDES-DOCUSATE SODIUM 8.6-50 MG PO TABS
2.0000 | ORAL_TABLET | ORAL | Status: DC
  Administered 2019-08-02: 2 via ORAL
  Filled 2019-08-02: qty 2

## 2019-08-02 MED ORDER — SIMETHICONE 80 MG PO CHEW: 80.0000 mg | CHEWABLE_TABLET | ORAL | Status: DC | PRN

## 2019-08-02 MED ORDER — BENZOCAINE-MENTHOL 20-0.5 % EX AERO
1.0000 "application " | INHALATION_SPRAY | CUTANEOUS | Status: DC | PRN
  Filled 2019-08-02: qty 56

## 2019-08-02 MED ORDER — PRENATAL MULTIVITAMIN CH
1.0000 | ORAL_TABLET | Freq: Every day | ORAL | Status: DC
  Administered 2019-08-02 – 2019-08-03 (×2): 1 via ORAL
  Filled 2019-08-02 (×2): qty 1

## 2019-08-02 MED ORDER — WITCH HAZEL-GLYCERIN EX PADS
1.0000 "application " | MEDICATED_PAD | CUTANEOUS | Status: DC | PRN
  Administered 2019-08-03: 1 via TOPICAL
  Filled 2019-08-02 (×2): qty 100

## 2019-08-02 MED ORDER — COCONUT OIL OIL
1.0000 "application " | TOPICAL_OIL | Status: DC | PRN
  Administered 2019-08-03: 1 via TOPICAL
  Filled 2019-08-02: qty 120

## 2019-08-02 MED ORDER — DIBUCAINE (PERIANAL) 1 % EX OINT: 1.0000 "application " | TOPICAL_OINTMENT | CUTANEOUS | Status: DC | PRN

## 2019-08-02 NOTE — Discharge Summary (Signed)
Obstetrical Discharge Summary  Patient Name: Dawn Weaver DOB: 1985/11/17 MRN: [id number]  Date of Admission: 08/01/2019 Date of Delivery: 08/02/19 Delivered by: Hassan Buckler CNM Date of Discharge: 08/03/2019  Primary OB: [facility]   MOQ:HUTMLYY'T last menstrual period was 10/20/2018 (exact date). EDC Estimated Date of Delivery: 07/27/19 Gestational Age at Delivery: [redacted]w[redacted]d   Antepartum complications:  1. Dating by [place], conceived after miscarriage late July 2020 2. Advanced maternal age, 81 at delivery 3. Anemia, not taking PO supplement d/t difficulty with pills.   Admitting Diagnosis: Ruptured membranes Secondary Diagnosis:SVD, meconium stained fluid, 1st deg   Patient Active Problem List   Diagnosis Date Noted  . PROM with onset of labor within 24 hours of rupture 08/01/2019    Augmentation: Pitocin Complications: None Intrapartum complications/course: admitted with SROM, 2cm, augment with Pitocin.  Date of Delivery: 08/02/19 Delivered by: Hassan Buckler CNM Delivery Type: spontaneous vaginal delivery Anesthesia: epidural Placenta: spontaneous Laceration: 1st deg lac  Episiotomy: none Newborn Data: Live born female "Elijah" Birth Weight: 3300g 7lb 4.4oz APGAR: 8, 9  Newborn Delivery   Birth date/time: 08/02/2019 00:30:00 Delivery type: Vaginal, Spontaneous      Postpartum Procedures: none  Edinburgh:  Edinburgh Postnatal Depression Scale Screening Tool 08/02/2019 08/02/2019  I have been able to laugh and see the funny side of things. 0 (No Data)  I have looked forward with enjoyment to things. 0 -  I have blamed myself unnecessarily when things went wrong. 2 -  I have been anxious or worried for no good reason. 2 -  I have felt scared or panicky for no good reason. 2 -  Things have been getting on top of me. 1 -  I have been so unhappy that I have had difficulty sleeping. 1 -  I have felt sad or miserable. 1 -  I have been so unhappy that I have been  crying. 1 -  The thought of harming myself has occurred to me. 0 -  Edinburgh Postnatal Depression Scale Total 10 -      Post partum course:  Patient had an uncomplicated postpartum course.  By time of discharge on PPD#1, her pain was controlled on oral pain medications; she had appropriate lochia and was ambulating, voiding without difficulty and tolerating regular diet.  She was deemed stable for discharge to home.     Discharge Physical Exam:  BP 104/67 (BP Location: Right Arm)   Pulse 73   Temp 98.4 F (36.9 C) (Oral)   Resp 18   Ht 5\' 2"  (1.575 m)   Wt 64.9 kg   LMP 10/20/2018 (Exact Date)   SpO2 99%   Breastfeeding Unknown   BMI 26.16 kg/m   General: NAD CV: RRR Pulm: CTABL, nl effort ABD: s/nd/nt, fundus firm and below the umbilicus Lochia: moderate DVT Evaluation: LE non-ttp, no evidence of DVT on exam.  Hemoglobin  Date Value Ref Range Status  08/03/2019 9.3 (L) 12.0 - 15.0 g/dL Final   HCT  Date Value Ref Range Status  08/03/2019 29.4 (L) 36.0 - 46.0 % Final     Disposition: stable, discharge to home. Baby Feeding: breastmilk Baby Disposition: home with mom  Rh Immune globulin given: n/a Rubella vaccine given: immune Varicella vaccine given: immune Tdap vaccine given in AP or PP setting: given 05/26/19 Flu vaccine given in AP or PP setting: given 02/09/19  Contraception: NFP  Prenatal Labs:   Blood type/Rh  O pos  Antibody screen neg  Rubella Immune  Varicella Immune  RPR NR  HBsAg Neg  HIV NR  GC neg  Chlamydia neg  Genetic screening 01/12/19: neg, XY; AFP negative  1 hour GTT  117  3 hour GTT  n/a  GBS  neg     Plan:  Dawn Weaver was discharged to home in good condition. Follow-up appointment with delivering provider in 2 weeks.  Discharge Medications: Allergies as of 08/03/2019   No Known Allergies     Medication List    TAKE these medications   acetaminophen 325 MG tablet Commonly known as: Tylenol Take 2 tablets  (650 mg total) by mouth every 4 (four) hours as needed for mild pain or moderate pain.   ferrous sulfate 325 (65 FE) MG tablet Take 1 tablet (325 mg total) by mouth daily with breakfast.   ibuprofen 600 MG tablet Commonly known as: ADVIL Take 1 tablet (600 mg total) by mouth every 6 (six) hours as needed for mild pain, moderate pain or cramping.   prenatal multivitamin Tabs tablet Take 1 tablet by mouth daily at 12 noon.       Follow-up Information    McVey, Prudencio Pair, CNM. Schedule an appointment as soon as possible for a visit in 2 week(s).   Specialty: Obstetrics and Gynecology Why: 2 weeks for depression check, 6 weeks for routine postpartum visit Contact information: [address] [place] [address] [phone]           Signed: Genia Del, CNM 08/03/2019  9:38 AM

## 2019-08-02 NOTE — Clinical Social Work Maternal (Addendum)
CLINICAL SOCIAL WORK MATERNAL/CHILD NOTE  Patient Details  Name: Dawn Weaver MRN: [id number] Date of Birth: Aug 02, 1985  Date:  08/02/2019  Clinical Social Worker Initiating Note:  Oleh Genin, LCSW Date/Time: Initiated:  08/02/19/      Child's Name:  Carlynn Purl   Biological Parents:  Mother, Father   Need for Interpreter:  None   Reason for Referral:  Other (Comment)(Edinburgh Score: 10)   Address:  [address] [place] [address]    Phone number:  [phone] (home)     Additional phone number: None reported.  Household Members/Support Persons (HM/SP):   Household Member/Support Person 1, Household Member/Support Person 2, Household Member/Support Person 3, Household Member/Support Person 4, Household Member/Support Person 5   HM/SP Name Relationship DOB or Age  HM/SP -40 Isaiah son 40  HM/SP -2 Valarie Merino son 71  HM/SP -3 Timmy Berberich FOB/Spouse unknown  HM/SP -4 u FOB's Grandmother unknown  HM/SP -5 unknown FOB's Cousin unknown  HM/SP -6        HM/SP -7        HM/SP -8          Natural Supports (not living in the home):  Extended Family, Friends, Immediate Family   Professional Supports:     Employment: Part-time   Type of Work: Ecologist   Education:  9 to 11 years(Completed 11th grade)   Homebound arranged: No  Financial Resources:  [employer]   Other Resources:  [employer], Physicist, medical    Cultural/Religious Considerations Which May Impact Care:  None reported.   Strengths:  Ability to meet basic needs , Compliance with medical plan , Home prepared for child , Understanding of illness, Pediatrician chosen   Psychotropic Medications:         Pediatrician:    [facility]  Pediatrician List:   [facility]      Pediatrician Fax Number:    Risk Factors/Current Problems:  None   Cognitive State:  Alert , Able  to Concentrate , Goal Oriented    Mood/Affect:  Calm , Happy    CSW Assessment: CSW received a consult due to MOB's Edinburgh Score of 10. CSW spoke with RN Madelyn prior to assessment with MOB, Madelyn denied any additional concerns.   CSW met with MOB at bedside. Explained role and reason for referral.   MOB reported she has been feeling more anxious the last 7 days which she attributed to anticipating Baby's birth. MOB reported she is feeling better now that Baby Sydnee Cabal is born. MOB denied any mental health history or current issues. MOB denied SI, HI, or DV. MOB reported she is coping well emotionally and denied the need for support resources at this time.  MOB reported she has a good support system. MOB and Baby will live with MOB's other 2 sons, FOB, FOB's grandma, and FOB's cousin. MOB reported she receives Physicist, medical and [facility] and will inform her Workers [employer] birth. MOB plans to use [facility] for [facility]. MOB reported she has a crib, new car seat, and all other items needed for Baby.   CSW provided education and information sheets on PPD and SIDS. Encouraged MOB to reach out with any needs for support or questions, even after discharge.   FOB arrived in room towards end of assessment. MOB and FOB denied any needs at  this time and was encouraged to reach out if needs arise.   CSW Plan/Description:  Sudden Infant Death Syndrome (SIDS) Education, Perinatal Mood and Anxiety Disorder (PMADs) Education, No Further Intervention Required/No Barriers to Discharge    [place], LCSW 08/02/2019, 3:26 PM

## 2019-08-02 NOTE — Progress Notes (Signed)
IOL for post-date pregnancy  34 y.o. [id number] at [redacted]w[redacted]d based on an LMP of 10/20/18 consistent with ultrasound @ [redacted]w[redacted]d .Estimated Date of Delivery: 07/27/2019  Sex of baby and name: boy "Elijah"             FOB: Tim   Factors complicating this pregnancy 1. IOL scheduled for 08/03/19  2. Advanced maternal age  Will be 89 at delivery  Genetic Screening: Negative  EFW and AFI at 38-39 weeks - ordered 4/15  38-39 weeks Date: 07/22/19 [id number] Percentile: 69th%ile AFI: @ 57th%ile  NST/AFI twice weekly at 39 weeks  07/22/19 Reactive  Delivery 39+0 to 40+0, if declined continue antenatal testing with daily FKC until 41 weeks   Would like to wait for spontaneous labor - ok with 41 week IOL 3. Anemia  Encouraged Fe supplementation - is not taking d/t difficulty swallowing pills, encouraged liquid forms of Fe    Screening results and needs:  NOB:   Medicaid Questionnaire: 01/12/2019 mah  Depression Score: 6  MBT: O Positive Ab screen: Negative Pap: Negative HIV: Negative Hep B/RPR: Neg/Non-reactive G/C: Negative  Rubella: immune VZV: Immune  Aneuploidy:   First trimester:   MaternitT21: ordered 01/12/2019, negative XY 01/18/2019  Second trimester (AFP): ordered 03/09/19 Negative  28 weeks:   Review Medicaid Questionnaire:no change  Depression Score: 5  Blood consent:signed consent 05/26/19 dlc  Hgb:10.8 Platelets:287 Glucola:117 Rhogam: n/a  36 weeks:   GBS: Neg G/C: Neg/Neg Hgb: 10.1 Platelets: 261 HIV/RPR: Neg/NR  Last [place]:   01/12/2019: single, viable IUP, S=[redacted]w[redacted]d EDD by u/s=07/27/2019, FHR=163bpm, Yolk sac and amnion seen, Cervical length=3.82cm, Rt ovary contains 2 cysts: 1)corpus luteum=2.3cm 2)simple=1.3cm, No free fluid seen  07/22/19: Vertex, Fhr=138 bpm, Lt lat plac, Afi=143 mm @ 57%, Efw=3632 g @ 69%  07/25/19: AFI 18.95cm; placenta - anterior; position - vertex; FHT 130bpm   08/01/2019: AFI 15.8cm; placenta - anterior; position - vertex; FHT  138bpm  Immunization:   Flu in season - 02/09/19 [facility]  Tdap at 27-36 weeks - given 05/26/19 [facility]  Contraception Plan: natural family planning  Feeding Plan: breast

## 2019-08-02 NOTE — Lactation Note (Signed)
This note was copied from a baby's chart. Lactation Consultation Note  Patient Name: Dawn Weaver [id number] Date: 08/02/2019 Reason for consult: Initial assessment;Term  LC in to see mom and baby Elijah. Mom is G6P3. She breastfed her 2 older children for 11 months and 13 months respectively, no major obstacles noted.  Mom reports feedings to be going well, tugging at the breast, with baby satisfied post feedings, mom in no pain or discomfort with feeding.  LC reviewed breastfeeding basics, newborn stomach size, feeding patterns, wet/stool expectations, early hunger cues, benefits of skin to skin, milk supply and demand, and normal course of lactation.  Encouraged mom to call out for support today as needed.  Maternal Data Formula Feeding for Exclusion: No Has patient been taught Hand Expression?: Yes Does the patient have breastfeeding experience prior to this delivery?: Yes  Feeding Feeding Type: Breast Fed  LATCH Score                   Interventions Interventions: Breast feeding basics reviewed  Lactation Tools Discussed/Used     Consult Status Consult Status: Follow-up Date: 08/02/19 Follow-up type: In-patient    Danford Bad 08/02/2019, 10:53 AM

## 2019-08-02 NOTE — Progress Notes (Signed)
Post Partum - day of delivery   Subjective: no complaints, up ad lib, voiding and tolerating PO  Doing well, no concerns. Ambulating without difficulty, pain managed with PO meds, tolerating regular diet, and voiding without difficulty.   No fever/chills, chest pain, shortness of breath, nausea/vomiting, or leg pain. No nipple or breast pain. No headache, visual changes, or RUQ/epigastric pain.  Objective: BP 116/77 (BP Location: Left Arm)   Pulse 72   Temp 98.4 F (36.9 C) (Oral)   Resp 20   Ht 5\' 2"  (1.575 m)   Wt 64.9 kg   LMP 10/20/2018 (Exact Date)   SpO2 99%   Breastfeeding Unknown   BMI 26.16 kg/m    Physical Exam:  General: alert, cooperative and no distress Breasts: soft/nontender CV: RRR Pulm: nl effort, CTABL Abdomen: soft, non-tender, active bowel sounds Uterine Fundus: firm Perineum: minimal edema, intact Lochia: appropriate DVT Evaluation: No evidence of DVT seen on physical exam.  Recent Labs    08/01/19 2000 08/02/19 0412  HGB 9.8* 9.1*  HCT 30.4* 28.5*  WBC 12.4* 21.0*  PLT 316 304    Assessment/Plan: 34 y.o. 31 postpartum day - day of delivery   -Continue routine postpartum care -Lactation consult PRN for breastfeeding -Discussed contraceptive options including implant, IUDs hormonal and non-hormonal, injection, pills/ring/patch, condoms, and NFP.  -Acute blood loss anemia - hemodynamically stable and asymptomatic; start PO ferrous sulfate BID with stool softeners  -recheck CBC in AM  -Immunization status: all immunizations up to date  Disposition: Continue inpatient postpartum care    LOS: 1 day   [id number], CNM 08/02/2019, 8:59 AM   ----- 10/02/2019  Certified Nurse Midwife [facility]

## 2019-08-03 LAB — CBC
HCT: 29.4 % — ABNORMAL LOW (ref 36.0–46.0)
Hemoglobin: 9.3 g/dL — ABNORMAL LOW (ref 12.0–15.0)
MCH: 24.8 pg — ABNORMAL LOW (ref 26.0–34.0)
MCHC: 31.6 g/dL (ref 30.0–36.0)
MCV: 78.4 fL — ABNORMAL LOW (ref 80.0–100.0)
Platelets: 303 10*3/uL (ref 150–400)
RBC: 3.75 MIL/uL — ABNORMAL LOW (ref 3.87–5.11)
RDW: 15.8 % — ABNORMAL HIGH (ref 11.5–15.5)
WBC: 12.8 10*3/uL — ABNORMAL HIGH (ref 4.0–10.5)
nRBC: 0 % (ref 0.0–0.2)

## 2019-08-03 MED ORDER — PRENATAL MULTIVITAMIN CH: 1.0000 | ORAL_TABLET | Freq: Every day | ORAL | Status: AC

## 2019-08-03 MED ORDER — IBUPROFEN 600 MG PO TABS: 600.0000 mg | ORAL_TABLET | Freq: Four times a day (QID) | ORAL | 0 refills | Status: AC | PRN

## 2019-08-03 MED ORDER — ACETAMINOPHEN 325 MG PO TABS: 650.0000 mg | ORAL_TABLET | ORAL | Status: AC | PRN

## 2019-08-03 MED ORDER — FERROUS SULFATE 325 (65 FE) MG PO TABS
325.0000 mg | ORAL_TABLET | Freq: Two times a day (BID) | ORAL | Status: DC
  Administered 2019-08-03: 08:00:00 325 mg via ORAL
  Filled 2019-08-03: qty 1

## 2019-08-03 MED ORDER — FERROUS SULFATE 325 (65 FE) MG PO TABS: 325.0000 mg | ORAL_TABLET | Freq: Every day | ORAL | 0 refills | Status: AC

## 2019-08-03 NOTE — Discharge Instructions (Signed)
Please call your doctor or return to the ER if you experience any chest pains, shortness of breath, dizziness, visual changes, severe headache (unrelieved by pain meds), fever greater than 101, any heavy bleeding (saturating more than 1 pad per hour), large clots, or foul smelling discharge, any worsening abdominal pain and cramping that is not controlled by pain medication, any calf/leg pain or redness, any breast concerns (redness/pain), or any signs of postpartum depression. No tampons, enemas, douches, or sexual intercourse for 6 weeks. Also avoid tub baths, hot tubs, or swimming for 6 weeks.        After Your Delivery Discharge Instructions   Postpartum: Care Instructions  After childbirth (postpartum period), your body goes through many changes. Some of these changes happen over several weeks. In the hours after delivery, your body will begin to recover from childbirth while it prepares to breastfeed your newborn. You may feel emotional during this time. Your hormones can shift your mood without warning for no clear reason.  In the first couple of weeks after childbirth, many women have emotions that change from happy to sad. You may find it hard to sleep. You may cry a lot. This is called the "baby blues." These overwhelming emotions often go away within a couple of days or weeks. But it's important to discuss your feelings with your doctor.  You should call your care provider if you have unrelieved feelings of:  Inability to cope  Sadness  Anxiety  Lack of interest in baby  Insomnia  Crying  It is easy to get too tired and overwhelmed during the first weeks after childbirth. Don't try to do too much. Get rest whenever you can, accept help from others, and eat well and drink plenty of fluids.  About 4 to 6 weeks after your baby's birth, you will have a follow-up visit with your care provider. This visit is your time to talk to your provider about anything you are concerned or  curious about.  Follow-up care is a key part of your treatment and safety. Be sure to make and go to all appointments, and call your doctor if you are having problems. It's also a good idea to know your test results and keep a list of the medicines you take.  How can you care for yourself at home?  Sleep or rest when your baby sleeps.  Get help with household chores from family or friends, if you can. Do not try to do it all yourself.  If you have hemorrhoids or swelling or pain around the opening of your vagina, try using cold and heat. You can put ice or a cold pack on the area for 10 to 20 minutes at a time. Put a thin cloth between the ice and your skin. Also try sitting in a few inches of warm water (sitz bath) 3 times a day and after bowel movements.  Take pain medicines exactly as directed.  If the provider gave you a prescription medicine for pain, take it as prescribed.  If you do not have a prescription and need something over the counter, you can take:  Ibuprofen (Motrin, Advil) up to 600mg every 6 hours as needed for pain  Acetaminophen (Tylenol) up to 650mg every 4 hours as needed for pain  Some people find it helpful to alternate between these two medications.   No driving for 1-2 weeks or while taking pain medications.   Eat more fiber to avoid constipation. Include foods such as whole-grain breads   and cereals, raw vegetables, raw and dried fruits, and beans.  Drink plenty of fluids, enough so that your urine is light yellow or clear like water. If you have kidney, heart, or liver disease and have to limit fluids, talk with your doctor before you increase the amount of fluids you drink.  Do not put anything in the vagina for 6 weeks. This means no sex, no tampons, no douching, and no enemas.  If you have stitches, keep the area clean by pouring or spraying warm water over the area outside your vagina and anus after you use the toilet.  No strenuous activity or heavy  lifting for 6 weeks   No tub baths; showers only  Continue prenatal vitamin and iron.  If breastfeeding:  Increase calories and fluids while breastfeeding.  You may have a slight fever when your milk comes in, but it should go away on its own. If it does not, and rises above 101.0 please call the doctor.  For breastfeeding concerns, the lactation consultant can be reached at [phone].  For concerns about your baby, please call your pediatrician.   Keep a list of questions to bring to your postpartum visit. Your questions might be about:  Changes in your breasts, such as lumps or soreness.  When to expect your menstrual period to start again.  What form of birth control is best for you.  Weight you have put on during the pregnancy.  Exercise options.  What foods and drinks are best for you, especially if you are breastfeeding.  Problems you might be having with breastfeeding.  When you can have sex. Some women may want to talk about lubricants for the vagina.  Any feelings of sadness or restlessness that you are having.   When should you call for help?  Call 911 anytime you think you may need emergency care. For example, call if:  You have thoughts of harming yourself, your baby, or another person.  You passed out (lost consciousness).  Call the office at [phone] or seek immediate medical care if:  If you have heavy bleeding such that you are soaking 1 pad in an hour for 2 hours  You are dizzy or lightheaded, or you feel like you may faint.  You have a fever; a temperature of 101.0 F or greater  Chills  Difficulty urinating  Headache unrelieved by "pain meds"   Visual changes  Pain in the right side of your belly near your ribs  Breasts reddened, hard, hot to the touch or any other breast concerns  Nipple discharge which is foul-smelling or contains pus   New pain unrelieved with recommended over-the-counter dosages  Difficulty breathing  with or without chest pain   New leg pain, swelling, or redness, especially if it is only on one leg  Any other concerns  Watch closely for changes in your health, and be sure to contact your provider if:  You have new or worse vaginal discharge.  You feel sad or depressed.  You are having problems with your breasts or breastfeeding.    

## 2019-08-03 NOTE — Anesthesia Postprocedure Evaluation (Signed)
Anesthesia Post Note  Patient: Dawn Weaver  Procedure(s) Performed: AN AD HOC LABOR EPIDURAL  Patient location during evaluation: Mother Baby Anesthesia Type: Epidural Level of consciousness: awake and alert Pain management: pain level controlled Vital Signs Assessment: post-procedure vital signs reviewed and stable Respiratory status: spontaneous breathing, nonlabored ventilation and respiratory function stable Cardiovascular status: stable Postop Assessment: no headache, no backache and epidural receding Anesthetic complications: no     Last Vitals:  Vitals:   08/02/19 1526 08/02/19 2341  BP: 104/60 118/75  Pulse: 84 87  Resp: 18 18  Temp: 37 C 36.9 C  SpO2:  100%    Last Pain:  Vitals:   08/02/19 2341  TempSrc: Oral  PainSc:                  Elmarie Mainland

## 2019-08-03 NOTE — Lactation Note (Signed)
This note was copied from a baby's chart. Lactation Consultation Note  Patient Name: Dawn Weaver [id number] Date: 08/03/2019 Reason for consult: Follow-up assessment  LC in to check on mom and baby before discharge. Mom in side-lying position on bed, baby sleeping soundly after circumcision and most recent feeding. Baby is feeding well per 24hr bili, number of diapers, and mom's report.  Reviewed breastfeeding basics and what to expect in the days/weeks ahead. Encouraged to continue tracking diapers, reviewed growth spurts/cluster feeding, signs of adequate milk transfer, hunger cues, and deep latch with good position. LC talked to mom about breast and nipple care, breast fullness versus engorgement, differences in breast before/after feedings, and when to seek breastfeeding support or medical care. Mom had no questions or concerns at this time. Outpatient lactation services information given, as well as community breastfeeding support.  Maternal Data Formula Feeding for Exclusion: No Has patient been taught Hand Expression?: Yes Does the patient have breastfeeding experience prior to this delivery?: Yes  Feeding    LATCH Score                   Interventions Interventions: Breast feeding basics reviewed  Lactation Tools Discussed/Used     Consult Status Consult Status: Complete Date: 08/03/19 Follow-up type: Call as needed    Danford Bad 08/03/2019, 9:47 AM

## 2019-08-03 NOTE — Progress Notes (Signed)
Discharge order received from doctor. Reviewed discharge instructions and prescriptions with patient and answered all questions. Follow up appointment instructions given. Patient verbalized understanding. ID bands checked. Patient discharged home with infant via wheelchair by nursing/auxillary.    Taegen Lennox Garner, RN  

## 2019-08-03 NOTE — Anesthesia Preprocedure Evaluation (Signed)
Anesthesia Evaluation  Patient identified by MRN, date of birth, ID band Patient awake    Reviewed: Allergy & Precautions, H&P , NPO status , Patient's Chart, lab work & pertinent test results, reviewed documented beta blocker date and time   History of Anesthesia Complications Negative for: history of anesthetic complications  Airway Mallampati: II  TM Distance: >3 FB Neck ROM: full    Dental no notable dental hx.    Pulmonary neg pulmonary ROS, former smoker,    Pulmonary exam normal breath sounds clear to auscultation       Cardiovascular Exercise Tolerance: Good negative cardio ROS Normal cardiovascular exam Rhythm:regular Rate:Normal     Neuro/Psych negative neurological ROS  negative psych ROS   GI/Hepatic Neg liver ROS, GERD  ,  Endo/Other  negative endocrine ROS  Renal/GU negative Renal ROS  negative genitourinary   Musculoskeletal   Abdominal   Peds  Hematology  (+) Blood dyscrasia, anemia ,   Anesthesia Other Findings Past Medical History: No date: Anemia   Reproductive/Obstetrics (+) Pregnancy                             Anesthesia Physical Anesthesia Plan  ASA: II  Anesthesia Plan: Epidural   Post-op Pain Management:    Induction:   PONV Risk Score and Plan:   Airway Management Planned:   Additional Equipment:   Intra-op Plan:   Post-operative Plan:   Informed Consent: I have reviewed the patients History and Physical, chart, labs and discussed the procedure including the risks, benefits and alternatives for the proposed anesthesia with the patient or authorized representative who has indicated his/her understanding and acceptance.     Dental Advisory Given  Plan Discussed with: Anesthesiologist, CRNA and Surgeon  Anesthesia Plan Comments:         Anesthesia Quick Evaluation

## 2020-03-18 IMAGING — US OBSTETRIC <14 WK ULTRASOUND
1 series · 14 of 28 positions shown · non-contrast
Comparison: None.

CLINICAL DATA: Pregnant patient with bleeding. Shortness of breath.

EXAM:
OBSTETRIC <14 WK US AND TRANSVAGINAL OB US
TECHNIQUE: Both transabdominal and transvaginal ultrasound examinations were
performed for complete evaluation of the gestation as well as the
maternal uterus, adnexal regions, and pelvic cul-de-sac.
Transvaginal technique was performed to assess early pregnancy.

[Series 1: obstetric <14 wk ultrasound · 14 of 90 slices shown]
[im 4/90]
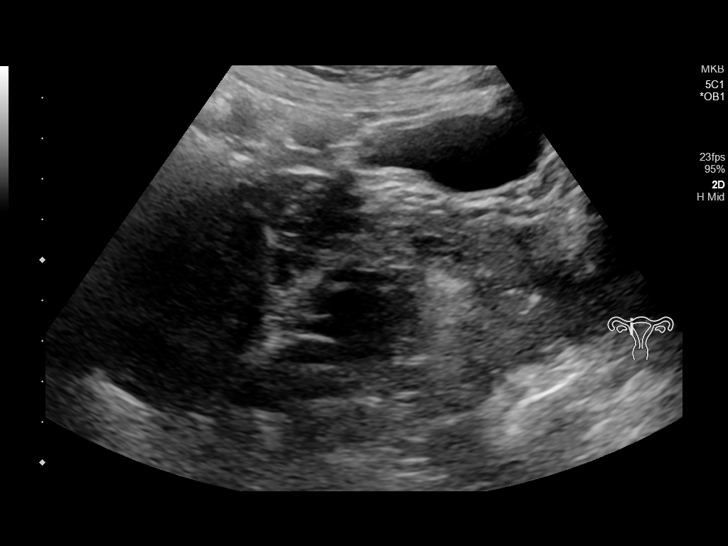
[im 10/90]
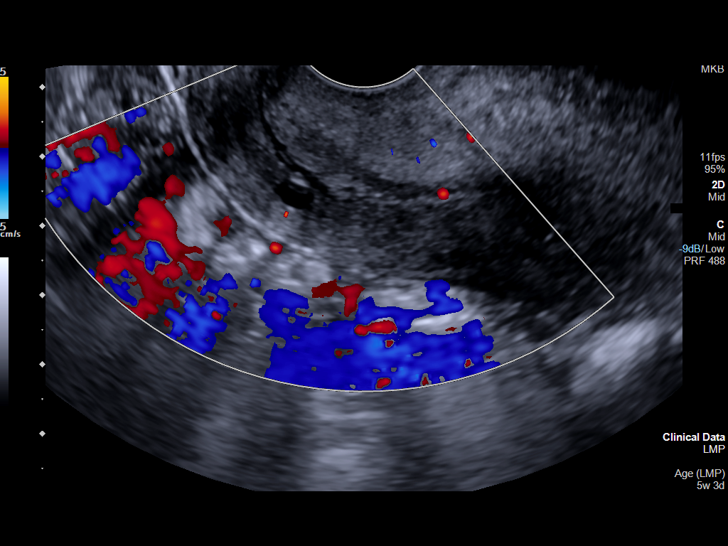
[im 17/90]
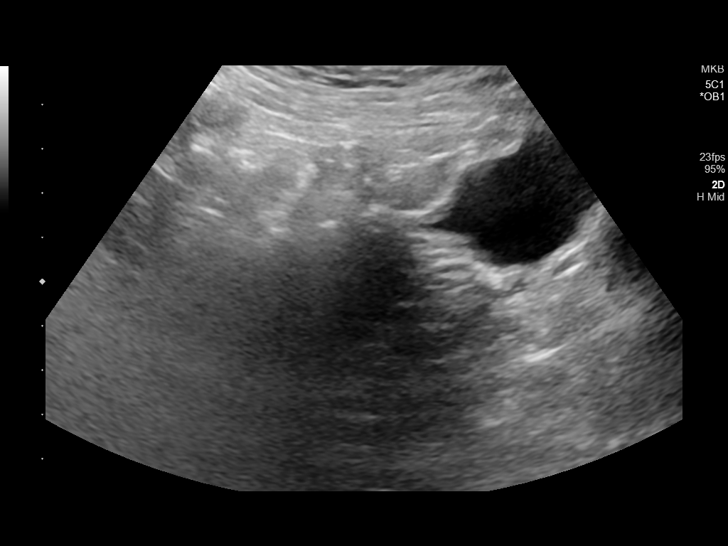
[im 24/90]
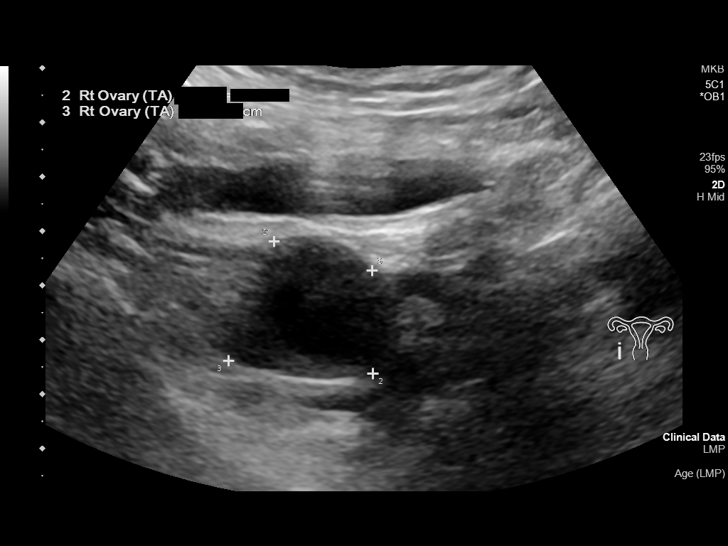
[im 30/90]
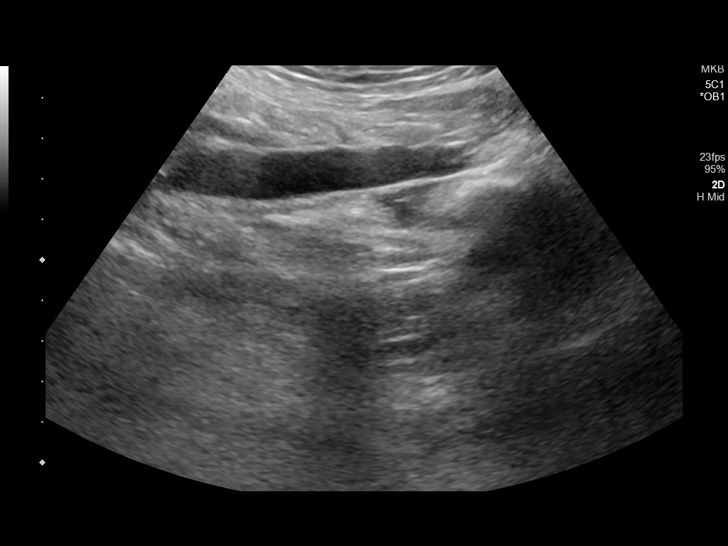
[im 37/90]
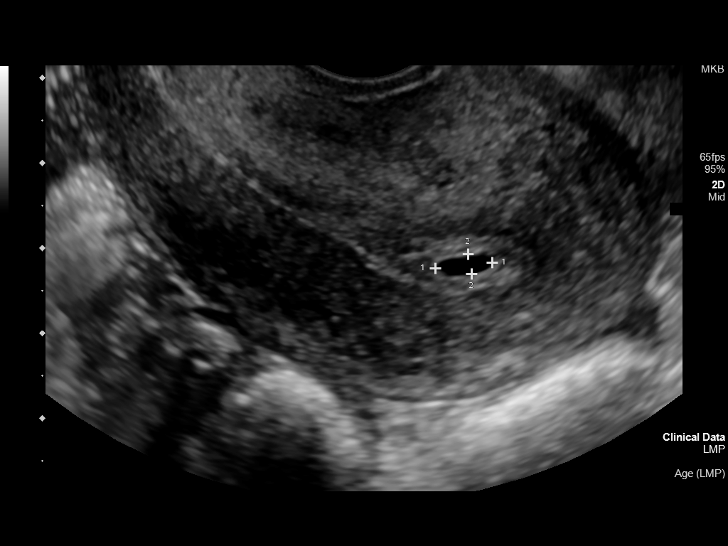
[im 43/90]
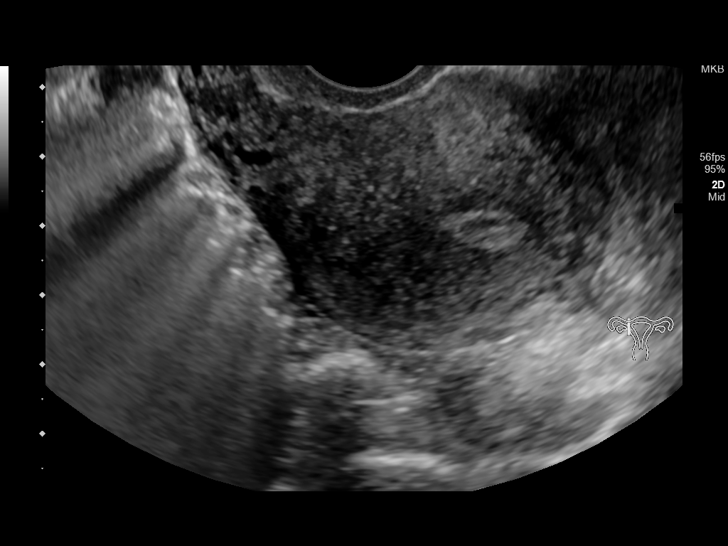
[im 50/90]
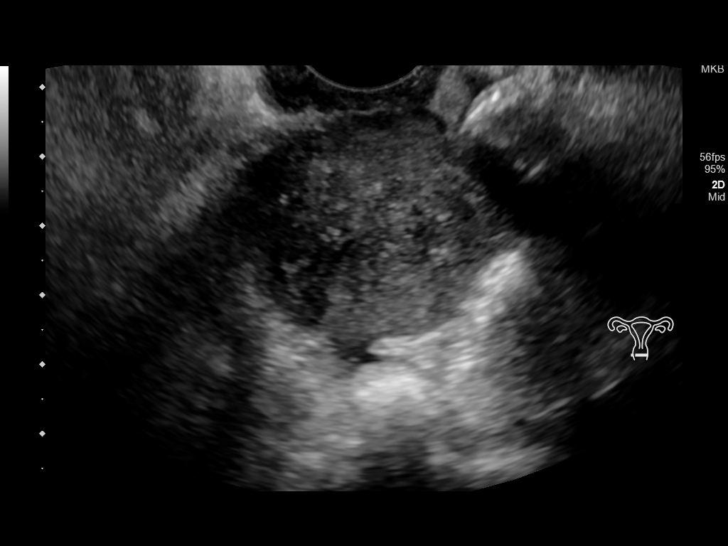
[im 57/90]
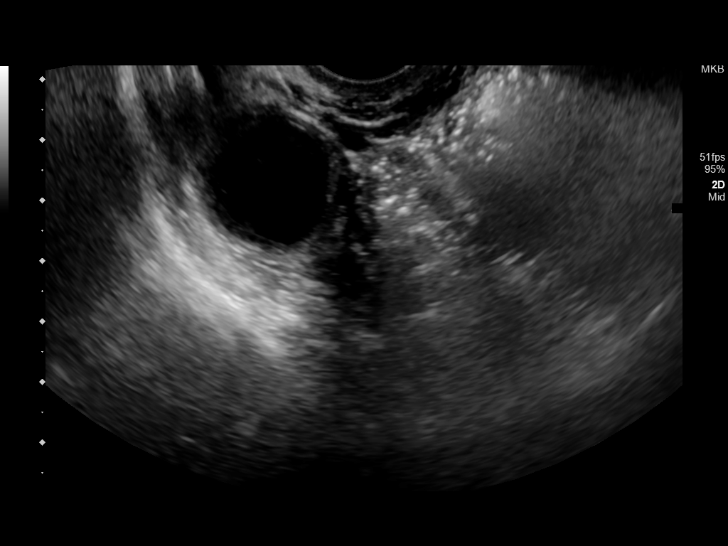
[im 63/90]
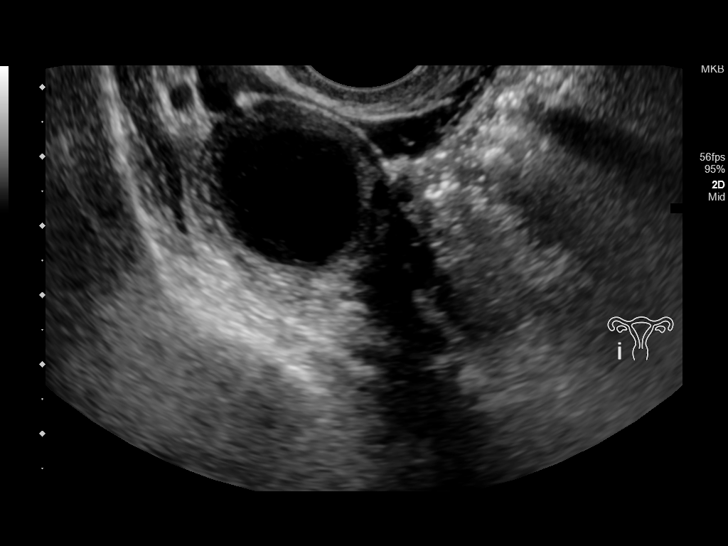
[im 70/90]
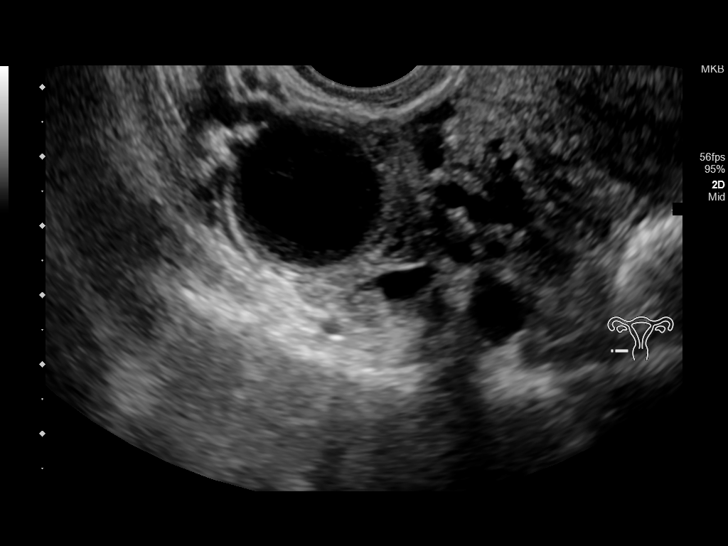
[im 76/90]
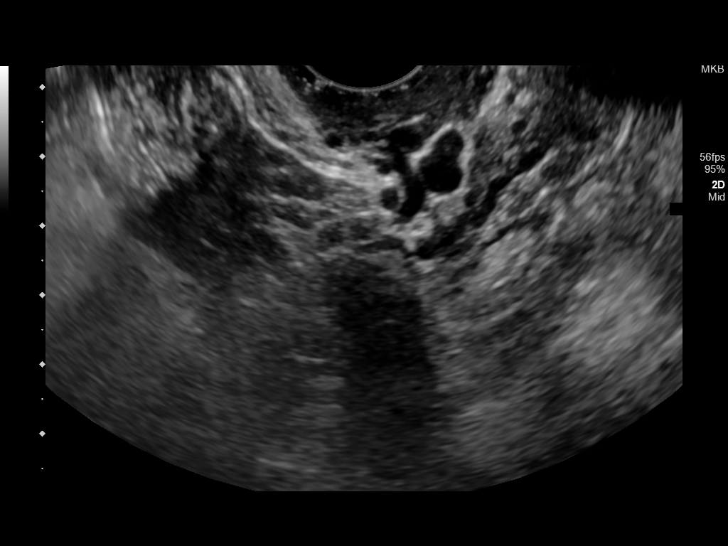
[im 83/90]
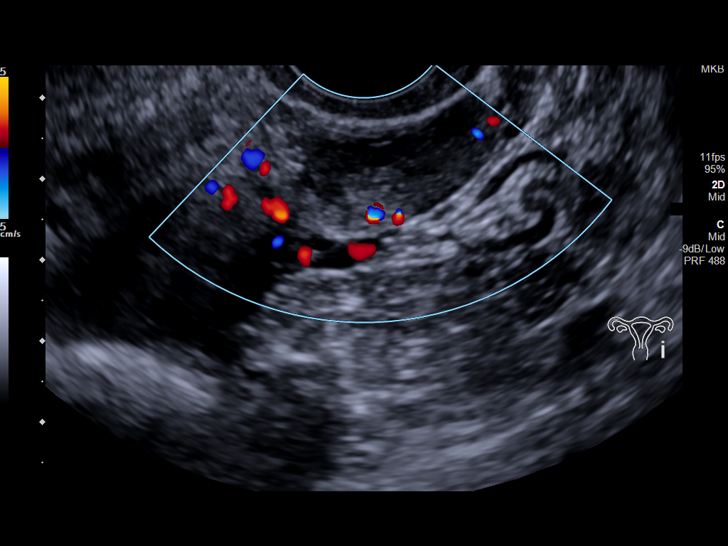
[im 90/90]
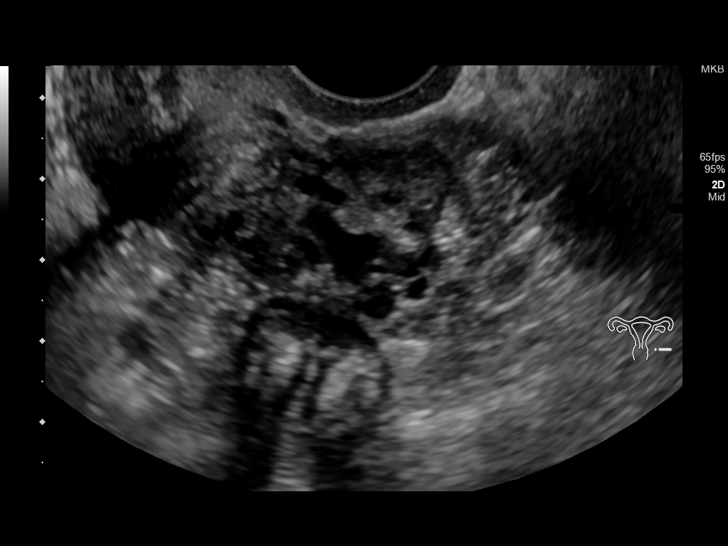

[14 of 28 positions shown; findings below may reference images not displayed]

FINDINGS: Intrauterine gestational sac: Probable small intrauterine
gestational sac.

Yolk sac:  Not visualized

Embryo:  Not visualized

MSD: 4.8 mm   5 w   2 d

CRL:    mm    w    d                  US EDC:

Subchorionic hemorrhage:  None visualized.

Maternal uterus/adnexae: Probable corpus luteum cyst on the right
measuring 2.5 cm.
IMPRESSION: 1. Probable early intrauterine gestational sac, but no yolk sac,
fetal pole, or cardiac activity yet visualized. Recommend follow-up
quantitative B-HCG levels and follow-up US in 14 days to assess
viability. This recommendation follows SRU consensus guidelines:
Diagnostic Criteria for Nonviable Pregnancy Early in the First
Trimester. N Engl J Med 6360; [DATE].
2. 2.5 cm corpus luteum cyst in the right ovary.
